# Patient Record
Sex: Female | Born: 1990 | Race: White | Hispanic: No | Smoking: Former smoker
Health system: Southern US, Community
[De-identification: ages and names within clinical notes are randomized; demographics above are authoritative.]

## PROBLEM LIST (undated history)

## (undated) ENCOUNTER — Inpatient Hospital Stay (HOSPITAL_COMMUNITY): Admission: RE | Payer: BLUE CROSS/BLUE SHIELD | Source: Ambulatory Visit

## (undated) ENCOUNTER — Inpatient Hospital Stay (HOSPITAL_COMMUNITY): Payer: Self-pay

## (undated) DIAGNOSIS — I499 Cardiac arrhythmia, unspecified: Secondary | ICD-10-CM

## (undated) DIAGNOSIS — K219 Gastro-esophageal reflux disease without esophagitis: Secondary | ICD-10-CM

## (undated) DIAGNOSIS — M419 Scoliosis, unspecified: Secondary | ICD-10-CM

## (undated) DIAGNOSIS — N390 Urinary tract infection, site not specified: Secondary | ICD-10-CM

## (undated) DIAGNOSIS — E162 Hypoglycemia, unspecified: Secondary | ICD-10-CM

## (undated) DIAGNOSIS — F32A Depression, unspecified: Secondary | ICD-10-CM

## (undated) DIAGNOSIS — R55 Syncope and collapse: Secondary | ICD-10-CM

## (undated) DIAGNOSIS — R51 Headache: Secondary | ICD-10-CM

## (undated) DIAGNOSIS — J45990 Exercise induced bronchospasm: Secondary | ICD-10-CM

## (undated) DIAGNOSIS — R519 Headache, unspecified: Secondary | ICD-10-CM

## (undated) DIAGNOSIS — R569 Unspecified convulsions: Secondary | ICD-10-CM

## (undated) DIAGNOSIS — F329 Major depressive disorder, single episode, unspecified: Secondary | ICD-10-CM

## (undated) HISTORY — PX: GASTROSTOMY TUBE PLACEMENT: SHX655

## (undated) HISTORY — PX: ABDOMINAL SURGERY: SHX537

## (undated) HISTORY — PX: MOUTH SURGERY: SHX715

---
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
## 2010-08-15 ENCOUNTER — Ambulatory Visit (HOSPITAL_COMMUNITY): Admission: RE | Admit: 2010-08-15 | Discharge: 2010-08-15 | Payer: Self-pay | Admitting: Obstetrics and Gynecology

## 2010-09-08 ENCOUNTER — Ambulatory Visit (HOSPITAL_COMMUNITY): Admission: RE | Admit: 2010-09-08 | Discharge: 2010-09-08 | Payer: Self-pay | Admitting: Obstetrics and Gynecology

## 2010-09-28 ENCOUNTER — Ambulatory Visit (HOSPITAL_COMMUNITY): Admission: RE | Admit: 2010-09-28 | Discharge: 2010-09-28 | Payer: Self-pay | Admitting: Obstetrics and Gynecology

## 2010-11-22 ENCOUNTER — Inpatient Hospital Stay (HOSPITAL_COMMUNITY)
Admission: AD | Admit: 2010-11-22 | Discharge: 2010-11-22 | Payer: Self-pay | Source: Home / Self Care | Admitting: Obstetrics and Gynecology

## 2010-11-22 DIAGNOSIS — R300 Dysuria: Secondary | ICD-10-CM

## 2010-11-22 DIAGNOSIS — O368190 Decreased fetal movements, unspecified trimester, not applicable or unspecified: Secondary | ICD-10-CM

## 2010-11-22 DIAGNOSIS — O212 Late vomiting of pregnancy: Secondary | ICD-10-CM

## 2010-12-31 ENCOUNTER — Inpatient Hospital Stay (HOSPITAL_COMMUNITY)
Admission: AD | Admit: 2010-12-31 | Discharge: 2011-01-01 | Payer: Self-pay | Source: Home / Self Care | Attending: Obstetrics and Gynecology | Admitting: Obstetrics and Gynecology

## 2011-01-07 ENCOUNTER — Encounter: Payer: Self-pay | Admitting: Obstetrics and Gynecology

## 2011-02-28 LAB — URINE CULTURE
Colony Count: 45000
Culture  Setup Time: 201112070146

## 2011-02-28 LAB — URINALYSIS, ROUTINE W REFLEX MICROSCOPIC
Bilirubin Urine: NEGATIVE
Glucose, UA: NEGATIVE mg/dL
Hgb urine dipstick: NEGATIVE
Ketones, ur: NEGATIVE mg/dL
Nitrite: NEGATIVE
Protein, ur: NEGATIVE mg/dL
Specific Gravity, Urine: 1.02 (ref 1.005–1.030)
Urobilinogen, UA: 0.2 mg/dL (ref 0.0–1.0)
pH: 6.5 (ref 5.0–8.0)

## 2011-02-28 LAB — URINE MICROSCOPIC-ADD ON

## 2011-03-22 LAB — COMPREHENSIVE METABOLIC PANEL
ALT: 15 U/L (ref 0–35)
AST: 22 U/L (ref 0–37)
Albumin: 4 g/dL (ref 3.5–5.2)
Alkaline Phosphatase: 44 U/L (ref 39–117)
BUN: 10 mg/dL (ref 6–23)
CO2: 26 mEq/L (ref 19–32)
Calcium: 9.2 mg/dL (ref 8.4–10.5)
Chloride: 108 mEq/L (ref 96–112)
Creatinine, Ser: 0.63 mg/dL (ref 0.4–1.2)
GFR calc Af Amer: >60 mL/min (ref >60)
GFR calc non Af Amer: >60 mL/min (ref >60)
Glucose, Bld: 101 mg/dL — ABNORMAL HIGH (ref 70–99)
Potassium: 4.3 mEq/L (ref 3.5–5.1)
Sodium: 138 mEq/L (ref 135–145)
Total Bilirubin: 0.3 mg/dL (ref 0.3–1.2)
Total Protein: 6.6 g/dL (ref 6.0–8.3)

## 2011-03-22 LAB — DIFFERENTIAL
Basophils Absolute: 0.1 10*3/uL (ref 0.0–0.1)
Basophils Relative: 1 % (ref 0–1)
Eosinophils Absolute: 0.1 10*3/uL (ref 0.0–0.7)
Eosinophils Relative: 2 % (ref 0–5)
Lymphocytes Relative: 42 % (ref 12–46)
Lymphs Abs: 2.3 10*3/uL (ref 0.7–4.0)
Monocytes Absolute: 0.6 10*3/uL (ref 0.1–1.0)
Monocytes Relative: 10 % (ref 3–12)
Neutro Abs: 2.3 10*3/uL (ref 1.7–7.7)
Neutrophils Relative %: 44 % (ref 43–77)

## 2011-03-22 LAB — URINE MICROSCOPIC-ADD ON

## 2011-03-22 LAB — URINALYSIS, ROUTINE W REFLEX MICROSCOPIC
Bilirubin Urine: NEGATIVE
Glucose, UA: NEGATIVE mg/dL
Ketones, ur: NEGATIVE mg/dL
Leukocytes, UA: NEGATIVE
Nitrite: NEGATIVE
Protein, ur: NEGATIVE mg/dL
Specific Gravity, Urine: 1.016 (ref 1.005–1.030)
Urobilinogen, UA: 0.2 mg/dL (ref 0.0–1.0)
pH: 6 (ref 5.0–8.0)

## 2011-03-22 LAB — CBC
HCT: 36.5 % (ref 36.0–46.0)
Hemoglobin: 13 g/dL (ref 12.0–15.0)
MCHC: 35.5 g/dL (ref 30.0–36.0)
MCV: 89.4 fL (ref 78.0–100.0)
Platelets: 270 10*3/uL (ref 150–400)
RBC: 4.09 MIL/uL (ref 3.87–5.11)
RDW: 12.6 % (ref 11.5–15.5)
WBC: 5.4 10*3/uL (ref 4.0–10.5)
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
## 2011-09-13 LAB — RAPID URINE DRUG SCREEN, HOSP PERFORMED
Amphetamines: POSITIVE — AB
Barbiturates: NOT DETECTED
Benzodiazepines: NOT DETECTED
Cocaine: NOT DETECTED
Opiates: NOT DETECTED
Tetrahydrocannabinol: NOT DETECTED

## 2011-09-13 LAB — DIFFERENTIAL
Basophils Absolute: 0
Basophils Relative: 1
Eosinophils Absolute: 0
Eosinophils Relative: 0
Lymphocytes Relative: 42
Lymphs Abs: 1.8
Monocytes Absolute: 0.4
Monocytes Relative: 9
Neutro Abs: 2.1
Neutrophils Relative %: 49

## 2011-09-13 LAB — URINALYSIS, ROUTINE W REFLEX MICROSCOPIC
Bilirubin Urine: NEGATIVE
Glucose, UA: NEGATIVE
Hgb urine dipstick: NEGATIVE
Ketones, ur: NEGATIVE
Nitrite: NEGATIVE
Protein, ur: NEGATIVE
Specific Gravity, Urine: 1.019
Urobilinogen, UA: 0.2
pH: 6.5

## 2011-09-13 LAB — POCT CARDIAC MARKERS
Myoglobin, poc: 35.6
Operator id: 261601
Troponin i, poc: <0.05

## 2011-09-13 LAB — CBC
HCT: 36.7
Hemoglobin: 12.7
MCHC: 34.7
MCV: 87.9
Platelets: 239
RBC: 4.18
RDW: 13.2
WBC: 4.2 — ABNORMAL LOW

## 2011-09-13 LAB — D-DIMER, QUANTITATIVE: D-Dimer, Quant: <0.22

## 2011-09-13 LAB — POCT I-STAT, CHEM 8
BUN: 12
Calcium, Ion: 1.29
Chloride: 105
Creatinine, Ser: 0.8
Glucose, Bld: 78
HCT: 37
Hemoglobin: 12.6
Potassium: 3.8
Sodium: 142
TCO2: 25

## 2011-09-13 LAB — PREGNANCY, URINE: Preg Test, Ur: NEGATIVE

## 2011-09-18 LAB — COMPREHENSIVE METABOLIC PANEL
ALT: 11
AST: 19
Albumin: 4.2
Alkaline Phosphatase: 57
Chloride: 107
Creatinine, Ser: 0.74
Potassium: 4.5
Sodium: 137
Total Bilirubin: 0.4

## 2011-09-18 LAB — ACETAMINOPHEN LEVEL: Acetaminophen (Tylenol), Serum: <10 — ABNORMAL LOW

## 2011-09-18 LAB — POCT I-STAT, CHEM 8
BUN: 10
Chloride: 109
Creatinine, Ser: 0.9
Potassium: 4.3
Sodium: 140

## 2011-09-18 LAB — URINALYSIS, ROUTINE W REFLEX MICROSCOPIC
Bilirubin Urine: NEGATIVE
Glucose, UA: NEGATIVE
Hgb urine dipstick: NEGATIVE
Ketones, ur: NEGATIVE
Nitrite: NEGATIVE
Protein, ur: NEGATIVE
Specific Gravity, Urine: 1.009
Urobilinogen, UA: 0.2
pH: 6

## 2011-09-18 LAB — APTT: aPTT: 28

## 2011-09-18 LAB — RAPID URINE DRUG SCREEN, HOSP PERFORMED
Amphetamines: NOT DETECTED
Barbiturates: NOT DETECTED
Benzodiazepines: NOT DETECTED
Cocaine: NOT DETECTED
Opiates: NOT DETECTED
Tetrahydrocannabinol: NOT DETECTED

## 2011-09-18 LAB — POCT PREGNANCY, URINE: Preg Test, Ur: NEGATIVE

## 2012-02-26 ENCOUNTER — Encounter (HOSPITAL_COMMUNITY): Payer: Self-pay | Admitting: *Deleted

## 2012-02-26 ENCOUNTER — Other Ambulatory Visit: Payer: Self-pay

## 2012-02-26 ENCOUNTER — Emergency Department (HOSPITAL_COMMUNITY)
Admission: EM | Admit: 2012-02-26 | Discharge: 2012-02-26 | Disposition: A | Discharge status: Home / Self Care | Payer: BC Managed Care – PPO | Attending: Emergency Medicine | Admitting: Emergency Medicine

## 2012-02-26 DIAGNOSIS — I499 Cardiac arrhythmia, unspecified: Secondary | ICD-10-CM | POA: Insufficient documentation

## 2012-02-26 DIAGNOSIS — R55 Syncope and collapse: Secondary | ICD-10-CM | POA: Insufficient documentation

## 2012-02-26 DIAGNOSIS — R42 Dizziness and giddiness: Secondary | ICD-10-CM | POA: Insufficient documentation

## 2012-02-26 DIAGNOSIS — J3489 Other specified disorders of nose and nasal sinuses: Secondary | ICD-10-CM | POA: Insufficient documentation

## 2012-02-26 DIAGNOSIS — R509 Fever, unspecified: Secondary | ICD-10-CM | POA: Insufficient documentation

## 2012-02-26 DIAGNOSIS — R059 Cough, unspecified: Secondary | ICD-10-CM | POA: Insufficient documentation

## 2012-02-26 DIAGNOSIS — R05 Cough: Secondary | ICD-10-CM | POA: Insufficient documentation

## 2012-02-26 HISTORY — DX: Hypoglycemia, unspecified: E16.2

## 2012-02-26 LAB — URINALYSIS, ROUTINE W REFLEX MICROSCOPIC
Bilirubin Urine: NEGATIVE
Nitrite: NEGATIVE
Specific Gravity, Urine: 1.005 (ref 1.005–1.030)
Urobilinogen, UA: 1 mg/dL (ref 0.0–1.0)

## 2012-02-26 LAB — DIFFERENTIAL
Basophils Relative: 0 % (ref 0–1)
Eosinophils Absolute: 0.1 10*3/uL (ref 0.0–0.7)
Lymphs Abs: 2.5 10*3/uL (ref 0.7–4.0)
Neutrophils Relative %: 57 % (ref 43–77)

## 2012-02-26 LAB — CBC
MCH: 29.3 pg (ref 26.0–34.0)
MCHC: 33.9 g/dL (ref 30.0–36.0)
Platelets: 241 10*3/uL (ref 150–400)
RBC: 4.3 MIL/uL (ref 3.87–5.11)

## 2012-02-26 LAB — BASIC METABOLIC PANEL
GFR calc Af Amer: >90 mL/min (ref >90)
GFR calc non Af Amer: >90 mL/min (ref >90)
Potassium: 3.8 mEq/L (ref 3.5–5.1)
Sodium: 141 mEq/L (ref 135–145)

## 2012-02-26 LAB — POCT PREGNANCY, URINE: Preg Test, Ur: NEGATIVE

## 2012-02-26 LAB — GLUCOSE, CAPILLARY

## 2012-02-26 LAB — TROPONIN I: Troponin I: <0.3 ng/mL (ref <0.30)

## 2012-02-26 MED ORDER — SODIUM CHLORIDE 0.9 % IV BOLUS (SEPSIS)
1000.0000 mL | Freq: Once | INTRAVENOUS | Status: AC
  Administered 2012-02-26: 1000 mL via INTRAVENOUS

## 2012-02-26 MED ORDER — IBUPROFEN 200 MG PO TABS
600.0000 mg | ORAL_TABLET | Freq: Once | ORAL | Status: AC
  Administered 2012-02-26: 600 mg via ORAL
  Filled 2012-02-26: qty 3

## 2012-02-26 NOTE — Discharge Instructions (Signed)
Nicole [name] your EKG shows you have premature complexes which is a benign finding.  The heart labs were normal today.  Call in the am and make an appointment with your PCP.  Tell them you were in the ER today.  You did not have a fever in the ER.  Your sugar was normal.  You may need to wear a holter monitor.  Your PCP can send you to a cardiologist for this.  Return to the ER for severe SOB or other concerns. We found no source of your fever.  Your white count is normal.  You are not pregnant.  Labs included in discharge.   Near-Syncope Near-syncope is sudden weakness, dizziness, or feeling like you might pass out (faint). This may occur when getting up after sitting or while standing for a long period of time. Near-syncope can be caused by a drop in blood pressure. This is a common reaction, but it may occur to a greater degree in people taking medicines to control their blood pressure. Fainting often occurs when the blood pressure or pulse is too low to provide enough blood flow to the brain to keep you conscious. Fainting and near-syncope are not usually due to serious medical problems. However, certain people should be more cautious in the event of near-syncope, including elderly patients, patients with diabetes, and patients with a history of heart conditions (especially irregular rhythms).  CAUSES   Drop in blood pressure.   Physical pain.   Dehydration.   Heat exhaustion.   Emotional distress.   Low blood sugar.   Internal bleeding.   Heart and circulatory problems.   Infections.  SYMPTOMS   Dizziness.   Feeling sick to your stomach (nauseous).   Nearly fainting.   Body numbness.   Turning pale.   Tunnel vision.   Weakness.  HOME CARE INSTRUCTIONS   Lie down right away if you start feeling like you might faint. Breathe deeply and steadily. Wait until all the symptoms have passed. Most of these episodes last only a few minutes. You may feel tired for several hours.    Drink enough fluids to keep your urine clear or pale yellow.   If you are taking blood pressure or heart medicine, get up slowly, taking several minutes to sit and then stand. This can reduce dizziness that is caused by a drop in blood pressure.  SEEK IMMEDIATE MEDICAL CARE IF:   You have a severe headache.   Unusual pain develops in the chest, abdomen, or back.   There is bleeding from the mouth or rectum, or you have black or tarry stool.   An irregular heartbeat or a very rapid pulse develops.   You have repeated fainting or seizure-like jerking during an episode.   You faint when sitting or lying down.   You develop confusion.   You have difficulty walking.   Severe weakness develops.   Vision problems develop.  MAKE SURE YOU:   Understand these instructions.   Will watch your condition.   Will get help right away if you are not doing well or get worse.  Document Released: 12/04/2005 Document Revised: 11/23/2011 Document Reviewed: 01/20/2011 [facility] Patient Information 2012 [place], [place].Near-Syncope Near-syncope is sudden weakness, dizziness, or feeling like you might pass out (faint). This may occur when getting up after sitting or while standing for a long period of time. Near-syncope can be caused by a drop in blood pressure. This is a common reaction, but it may occur to a greater  degree in people taking medicines to control their blood pressure. Fainting often occurs when the blood pressure or pulse is too low to provide enough blood flow to the brain to keep you conscious. Fainting and near-syncope are not usually due to serious medical problems. However, certain people should be more cautious in the event of near-syncope, including elderly patients, patients with diabetes, and patients with a history of heart conditions (especially irregular rhythms).  CAUSES   Drop in blood pressure.   Physical pain.   Dehydration.   Heat exhaustion.   Emotional  distress.   Low blood sugar.   Internal bleeding.   Heart and circulatory problems.   Infections.  SYMPTOMS   Dizziness.   Feeling sick to your stomach (nauseous).   Nearly fainting.   Body numbness.   Turning pale.   Tunnel vision.   Weakness.  HOME CARE INSTRUCTIONS   Lie down right away if you start feeling like you might faint. Breathe deeply and steadily. Wait until all the symptoms have passed. Most of these episodes last only a few minutes. You may feel tired for several hours.   Drink enough fluids to keep your urine clear or pale yellow.   If you are taking blood pressure or heart medicine, get up slowly, taking several minutes to sit and then stand. This can reduce dizziness that is caused by a drop in blood pressure.  SEEK IMMEDIATE MEDICAL CARE IF:   You have a severe headache.   Unusual pain develops in the chest, abdomen, or back.   There is bleeding from the mouth or rectum, or you have black or tarry stool.   An irregular heartbeat or a very rapid pulse develops.   You have repeated fainting or seizure-like jerking during an episode.   You faint when sitting or lying down.   You develop confusion.   You have difficulty walking.   Severe weakness develops.   Vision problems develop.  MAKE SURE YOU:   Understand these instructions.   Will watch your condition.   Will get help right away if you are not doing well or get worse.  Document Released: 12/04/2005 Document Revised: 11/23/2011 Document Reviewed: 01/20/2011 [employer] Patient Information 2012 [employer], [facility] Monitoring A Holter monitor is a small device with electrodes (small sticky patches) that attach to your chest. It records the electrical activity of your heart and is worn continuously for 24-48 hours.  A HOLTER MONITOR IS USED TO  Detect heart problems such as:   Heart arrhythmia. Is an abnormal or irregular heartbeat. With some heart arrhythmias, you may not feel or know  that you have an irregular heart rhythm.   Palpitations, such as feeling your heart racing or fluttering. It is possible to have heart palpitations and not have a heart arrhythmia.   A heart rhythm that is too slow or too fast.   If you have problems fainting, near fainting or feeling light-headed, a Holter monitor may be worn to see if your heart is the cause.  HOLTER MONITOR PREPARATION   Electrodes will be attached to the skin on your chest.   If you have hair on your chest, small areas may have to be shaved. This is done to help the patches stick better and make the recording more accurate.   The electrodes are attached by wires to the Holter monitor. The Holter monitor clips to your clothing. You will wear the monitor at all times, even while exercising and sleeping.  HOME CARE  INSTRUCTIONS   Wear your monitor at all times.   The wires and the monitor must stay dry. Do not get the monitor wet.   Do not bathe, swim or use a hot tub with it on.   You may do a "sponge" bath while you have the monitor on.   Keep your skin clean, do not put body lotion or moisturizer on your chest.   It's possible that your skin under the electrodes could become irritated. To keep this from happening, you may put the electrodes in slightly different places on your chest.   Your caregiver will also ask you to keep a diary of your activities, such as walking or doing chores. Be sure to note what you are doing if you experience heart symptoms such as palpitations. This will help your caregiver determine what might be contributing to your symptoms. The information stored in your monitor will be reviewed by your caregiver alongside your diary entries.   Make sure the monitor is safely clipped to your clothing or in a location close to your body that your caregiver recommends.   The monitor and electrodes are removed when the test is over. Return the monitor as directed.   Be sure to follow up with your  caregiver and discuss your Holter monitor results.  SEEK IMMEDIATE MEDICAL CARE IF:  You faint or feel lightheaded.   You have trouble breathing.   You get pain in your chest, upper arm or jaw.   You feel sick to your stomach and your skin is pale, cool, or damp.   You think something is wrong with the way your heart is beating.  MAKE SURE YOU:   Understand these instructions.   Will watch your condition.   Will get help right away if you are not doing well or get worse.  Document Released: 09/01/2004 Document Revised: 11/23/2011 Document Reviewed: 01/14/2009 [facility] Patient Information 2012 [place], [place].C.

## 2012-02-26 NOTE — ED Notes (Signed)
Pt ambulated to the restroom to void.

## 2012-02-26 NOTE — ED Provider Notes (Signed)
History     CSN: [id number]  Arrival date & time 02/26/12  1616   First MD Initiated Contact with Patient 02/26/12 1742      Chief Complaint  Patient presents with  . Hypoglycemia    pt reports dizzy spells since saturday, reports near sycope on saturday.   . Fever    (Consider location/radiation/quality/duration/timing/severity/associated sxs/prior treatment) Patient is a 21 y.o. female presenting with fever. The history is provided by the patient. No language interpreter was used.  Fever Primary symptoms of the febrile illness include fever and cough. Primary symptoms do not include headaches, wheezing, shortness of breath, abdominal pain, nausea, vomiting, diarrhea, dysuria or altered mental status. The current episode started 2 days ago. This is a new problem. The problem has not changed since onset. Reports subjective fever, upper respiratory symptoms x 2 days and dizziness with near syncope. Thinks that she is hypoglycemic.   Also states that she has a heart arrythmia but not sure what it is.  No SOB or chest pain. pmh of hypoglycemia also. She is a smoker. States that she is not pregnant that her and her husband use condoms.  LMP Feb 13.  Past Medical History  Diagnosis Date  . Hypoglycemia     History reviewed. No pertinent past surgical history.  History reviewed. No pertinent family history.  History  Substance Use Topics  . Smoking status: Current Everyday Smoker    Types: Cigarettes  . Smokeless tobacco: Not on file  . Alcohol Use: No    OB History    Grav Para Term Preterm Abortions TAB SAB Ect Mult Living                  Review of Systems  Constitutional: Positive for fever.  HENT: Positive for rhinorrhea, sneezing and postnasal drip. Negative for neck pain and neck stiffness.   Respiratory: Positive for cough. Negative for shortness of breath and wheezing.   Cardiovascular: Negative for chest pain.  Gastrointestinal: Negative for nausea, vomiting,  abdominal pain and diarrhea.  Genitourinary: Negative for dysuria, urgency, frequency, vaginal bleeding, vaginal discharge, difficulty urinating, vaginal pain and menstrual problem.  Musculoskeletal: Negative for back pain.  Neurological: Positive for dizziness. Negative for seizures, weakness, numbness and headaches.  Psychiatric/Behavioral: Negative for altered mental status.  All other systems reviewed and are negative.    Allergies  Review of patient's allergies indicates no known allergies.  Home Medications  No current outpatient prescriptions on file.  BP 113/76  Pulse 75  Temp(Src) 98.4 F (36.9 C) (Oral)  Resp 22  Wt 135 lb (61.236 kg)  SpO2 99%  LMP 02/02/2012  Physical Exam  Nursing note and vitals reviewed. Constitutional: She is oriented to person, place, and time. She appears well-developed and well-nourished.  HENT:  Head: Normocephalic and atraumatic.  Right Ear: Tympanic membrane normal. No tenderness.  Left Ear: Tympanic membrane normal. No tenderness.  Nose: Mucosal edema and rhinorrhea present. Right sinus exhibits no maxillary sinus tenderness and no frontal sinus tenderness. Left sinus exhibits no maxillary sinus tenderness and no frontal sinus tenderness.  Mouth/Throat: Uvula is midline and mucous membranes are normal. No oropharyngeal exudate, posterior oropharyngeal edema, posterior oropharyngeal erythema or tonsillar abscesses.  Eyes: Conjunctivae and EOM are normal. Pupils are equal, round, and reactive to light.  Neck: Normal range of motion. Neck supple.  Cardiovascular: Normal rate and intact distal pulses.  Exam reveals no gallop and no friction rub.   No murmur heard.  Irregular rhythm  Pulmonary/Chest: Effort normal and breath sounds normal. No respiratory distress. She has no wheezes.  Abdominal: Soft.  Musculoskeletal: Normal range of motion. She exhibits no edema and no tenderness.  Neurological: She is alert and oriented to person,  place, and time. She has normal reflexes.  Skin: Skin is warm and dry.  Psychiatric: She has a normal mood and affect.    ED Course  Procedures (including critical care time)  Labs Reviewed  URINALYSIS, ROUTINE W REFLEX MICROSCOPIC - Abnormal; Notable for the following:    Color, Urine STRAW (*)    Ketones, ur 15 (*)    All other components within normal limits  GLUCOSE, CAPILLARY  CBC  DIFFERENTIAL  BASIC METABOLIC PANEL  POCT PREGNANCY, URINE  TROPONIN I   No results found.   No diagnosis found.    MDM  20yo near syncope with uri and freq pac's.  Troponin negative. Feels better after IV fluids in the ER.  Plan to follow up with PCP tomorrow in [place].  Labs and EKG copy to pcp. Benign pac's in the ER.  ? Cause of dizziness.  May need halter monitor.  She will discuss with PCP tomorrow. CBC and Bmet unremarkable.    Labs Reviewed  URINALYSIS, ROUTINE W REFLEX MICROSCOPIC - Abnormal; Notable for the following:    Color, Urine STRAW (*)    Ketones, ur 15 (*)    All other components within normal limits  GLUCOSE, CAPILLARY  CBC  DIFFERENTIAL  BASIC METABOLIC PANEL  POCT PREGNANCY, URINE  TROPONIN I  LAB REPORT - SCANNED          Jethro Bastos, NP 02/28/12 1256

## 2012-02-26 NOTE — ED Notes (Signed)
Unable to run mini-lab troponin so per Dr. Rosalia Hammers order changed to main lab version.

## 2012-02-29 NOTE — ED Provider Notes (Signed)
History/physical exam/procedure(s) were performed by non-physician practitioner and as supervising physician I was immediately available for consultation/collaboration. I have reviewed all notes and am in agreement with care and plan.   Hilario Quarry, MD 02/29/12 [phone]

## 2012-04-29 ENCOUNTER — Other Ambulatory Visit: Payer: Self-pay | Admitting: Obstetrics and Gynecology

## 2012-04-29 DIAGNOSIS — N63 Unspecified lump in unspecified breast: Secondary | ICD-10-CM

## 2012-05-02 ENCOUNTER — Other Ambulatory Visit: Payer: BC Managed Care – PPO

## 2012-05-02 ENCOUNTER — Ambulatory Visit
Admission: RE | Admit: 2012-05-02 | Discharge: 2012-05-02 | Disposition: A | Discharge status: Home / Self Care | Payer: BC Managed Care – PPO | Source: Ambulatory Visit | Attending: Obstetrics and Gynecology | Admitting: Obstetrics and Gynecology

## 2012-05-02 DIAGNOSIS — N63 Unspecified lump in unspecified breast: Secondary | ICD-10-CM

## 2013-10-26 ENCOUNTER — Emergency Department (HOSPITAL_COMMUNITY)
Admission: EM | Admit: 2013-10-26 | Discharge: 2013-10-26 | Disposition: A | Discharge status: Home / Self Care | Payer: BC Managed Care – PPO | Attending: Emergency Medicine | Admitting: Emergency Medicine

## 2013-10-26 ENCOUNTER — Emergency Department (HOSPITAL_COMMUNITY): Payer: BC Managed Care – PPO

## 2013-10-26 ENCOUNTER — Encounter (HOSPITAL_COMMUNITY): Payer: Self-pay | Admitting: Emergency Medicine

## 2013-10-26 DIAGNOSIS — S60229A Contusion of unspecified hand, initial encounter: Secondary | ICD-10-CM | POA: Insufficient documentation

## 2013-10-26 DIAGNOSIS — M549 Dorsalgia, unspecified: Secondary | ICD-10-CM

## 2013-10-26 DIAGNOSIS — Z8739 Personal history of other diseases of the musculoskeletal system and connective tissue: Secondary | ICD-10-CM | POA: Insufficient documentation

## 2013-10-26 DIAGNOSIS — S0003XA Contusion of scalp, initial encounter: Secondary | ICD-10-CM | POA: Insufficient documentation

## 2013-10-26 DIAGNOSIS — Z862 Personal history of diseases of the blood and blood-forming organs and certain disorders involving the immune mechanism: Secondary | ICD-10-CM | POA: Insufficient documentation

## 2013-10-26 DIAGNOSIS — S60222A Contusion of left hand, initial encounter: Secondary | ICD-10-CM

## 2013-10-26 DIAGNOSIS — S1093XA Contusion of unspecified part of neck, initial encounter: Secondary | ICD-10-CM | POA: Insufficient documentation

## 2013-10-26 DIAGNOSIS — Y92410 Unspecified street and highway as the place of occurrence of the external cause: Secondary | ICD-10-CM | POA: Insufficient documentation

## 2013-10-26 DIAGNOSIS — Z8639 Personal history of other endocrine, nutritional and metabolic disease: Secondary | ICD-10-CM | POA: Insufficient documentation

## 2013-10-26 DIAGNOSIS — IMO0002 Reserved for concepts with insufficient information to code with codable children: Secondary | ICD-10-CM | POA: Insufficient documentation

## 2013-10-26 DIAGNOSIS — S7010XA Contusion of unspecified thigh, initial encounter: Secondary | ICD-10-CM | POA: Insufficient documentation

## 2013-10-26 DIAGNOSIS — S0083XA Contusion of other part of head, initial encounter: Secondary | ICD-10-CM

## 2013-10-26 DIAGNOSIS — F17200 Nicotine dependence, unspecified, uncomplicated: Secondary | ICD-10-CM | POA: Insufficient documentation

## 2013-10-26 DIAGNOSIS — M542 Cervicalgia: Secondary | ICD-10-CM

## 2013-10-26 DIAGNOSIS — Z3202 Encounter for pregnancy test, result negative: Secondary | ICD-10-CM | POA: Insufficient documentation

## 2013-10-26 DIAGNOSIS — Y9389 Activity, other specified: Secondary | ICD-10-CM | POA: Insufficient documentation

## 2013-10-26 MED ORDER — NAPROXEN 500 MG PO TABS: 500.0000 mg | ORAL_TABLET | Freq: Two times a day (BID) | ORAL | Status: DC

## 2013-10-26 MED ORDER — IBUPROFEN 400 MG PO TABS
600.0000 mg | ORAL_TABLET | Freq: Once | ORAL | Status: AC
Start: 2013-10-26 — End: 2013-10-26
  Administered 2013-10-26: 600 mg via ORAL
  Filled 2013-10-26: qty 2

## 2013-10-26 MED ORDER — CYCLOBENZAPRINE HCL 5 MG PO TABS: 5.0000 mg | ORAL_TABLET | Freq: Three times a day (TID) | ORAL | Status: DC | PRN

## 2013-10-26 MED ORDER — HYDROCODONE-ACETAMINOPHEN 5-325 MG PO TABS
1.0000 | ORAL_TABLET | Freq: Once | ORAL | Status: AC
  Administered 2013-10-26: 1 via ORAL
  Filled 2013-10-26: qty 1
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
## 2013-10-26 NOTE — ED Notes (Signed)
Crackers given with medication - patient states she is hungry

## 2013-10-26 NOTE — ED Notes (Signed)
Discharge instructions given and reviewed with patient.  Prescription given for Flexeril and Naproxen; effects and use explained.  Patient verbalized understanding of sedating effects of Flexeril and to take medications as directed.  Patient ambulatory; discharged home in good condition.  Spouse accompanied discharge to drive home.

## 2014-06-07 ENCOUNTER — Encounter (HOSPITAL_COMMUNITY): Payer: Self-pay | Admitting: Emergency Medicine

## 2014-06-07 ENCOUNTER — Emergency Department (HOSPITAL_COMMUNITY)
Admission: EM | Admit: 2014-06-07 | Discharge: 2014-06-07 | Disposition: A | Discharge status: Home / Self Care | Payer: BC Managed Care – PPO | Attending: Emergency Medicine | Admitting: Emergency Medicine

## 2014-06-07 DIAGNOSIS — Z87891 Personal history of nicotine dependence: Secondary | ICD-10-CM | POA: Insufficient documentation

## 2014-06-07 DIAGNOSIS — Z79899 Other long term (current) drug therapy: Secondary | ICD-10-CM | POA: Insufficient documentation

## 2014-06-07 DIAGNOSIS — Z8739 Personal history of other diseases of the musculoskeletal system and connective tissue: Secondary | ICD-10-CM | POA: Insufficient documentation

## 2014-06-07 DIAGNOSIS — N10 Acute tubulo-interstitial nephritis: Secondary | ICD-10-CM

## 2014-06-07 DIAGNOSIS — Z3202 Encounter for pregnancy test, result negative: Secondary | ICD-10-CM | POA: Insufficient documentation

## 2014-06-07 HISTORY — DX: Scoliosis, unspecified: M41.9

## 2014-06-07 LAB — COMPREHENSIVE METABOLIC PANEL
ALBUMIN: 3.9 g/dL (ref 3.5–5.2)
ALT: 9 U/L (ref 0–35)
AST: 14 U/L (ref 0–37)
Alkaline Phosphatase: 69 U/L (ref 39–117)
BILIRUBIN TOTAL: 0.2 mg/dL — AB (ref 0.3–1.2)
BUN: 8 mg/dL (ref 6–23)
CALCIUM: 8.8 mg/dL (ref 8.4–10.5)
CO2: 23 meq/L (ref 19–32)
CREATININE: 0.72 mg/dL (ref 0.50–1.10)
Chloride: 103 mEq/L (ref 96–112)
GFR calc Af Amer: >90 mL/min (ref >90)
Glucose, Bld: 95 mg/dL (ref 70–99)
Potassium: 3.8 mEq/L (ref 3.7–5.3)
Sodium: 139 mEq/L (ref 137–147)
Total Protein: 6.9 g/dL (ref 6.0–8.3)

## 2014-06-07 LAB — CBC WITH DIFFERENTIAL/PLATELET
BASOS ABS: 0 10*3/uL (ref 0.0–0.1)
BASOS PCT: 1 % (ref 0–1)
EOS PCT: 0 % (ref 0–5)
Eosinophils Absolute: 0 10*3/uL (ref 0.0–0.7)
HEMATOCRIT: 36.6 % (ref 36.0–46.0)
HEMOGLOBIN: 12.2 g/dL (ref 12.0–15.0)
Lymphocytes Relative: 22 % (ref 12–46)
Lymphs Abs: 0.9 10*3/uL (ref 0.7–4.0)
MCH: 29.9 pg (ref 26.0–34.0)
MCHC: 33.3 g/dL (ref 30.0–36.0)
MCV: 89.7 fL (ref 78.0–100.0)
MONO ABS: 0.6 10*3/uL (ref 0.1–1.0)
MONOS PCT: 14 % — AB (ref 3–12)
Neutro Abs: 2.5 10*3/uL (ref 1.7–7.7)
Neutrophils Relative %: 63 % (ref 43–77)
Platelets: 160 10*3/uL (ref 150–400)
RBC: 4.08 MIL/uL (ref 3.87–5.11)
RDW: 13.7 % (ref 11.5–15.5)
WBC: 4 10*3/uL (ref 4.0–10.5)

## 2014-06-07 LAB — URINALYSIS, ROUTINE W REFLEX MICROSCOPIC
Bilirubin Urine: NEGATIVE
Glucose, UA: NEGATIVE mg/dL
Ketones, ur: NEGATIVE mg/dL
LEUKOCYTES UA: NEGATIVE
NITRITE: POSITIVE — AB
PROTEIN: NEGATIVE mg/dL
Specific Gravity, Urine: 1.02 (ref 1.005–1.030)
UROBILINOGEN UA: 0.2 mg/dL (ref 0.0–1.0)
pH: 5.5 (ref 5.0–8.0)

## 2014-06-07 LAB — URINE MICROSCOPIC-ADD ON

## 2014-06-07 LAB — PREGNANCY, URINE: Preg Test, Ur: NEGATIVE

## 2014-06-07 MED ORDER — HYDROMORPHONE HCL PF 1 MG/ML IJ SOLN
1.0000 mg | INTRAMUSCULAR | Status: AC
  Administered 2014-06-07: 1 mg via INTRAVENOUS
  Filled 2014-06-07: qty 1

## 2014-06-07 MED ORDER — CIPROFLOXACIN HCL 500 MG PO TABS: 500.0000 mg | ORAL_TABLET | Freq: Two times a day (BID) | ORAL | Status: DC

## 2014-06-07 MED ORDER — SODIUM CHLORIDE 0.9 % IV BOLUS (SEPSIS)
1000.0000 mL | Freq: Once | INTRAVENOUS | Status: AC
  Administered 2014-06-07: 1000 mL via INTRAVENOUS

## 2014-06-07 MED ORDER — OXYCODONE-ACETAMINOPHEN 5-325 MG PO TABS: 1.0000 | ORAL_TABLET | ORAL | Status: DC | PRN

## 2014-06-07 MED ORDER — ONDANSETRON HCL 4 MG PO TABS: 4.0000 mg | ORAL_TABLET | Freq: Three times a day (TID) | ORAL | Status: DC | PRN

## 2014-06-07 MED ORDER — ACETAMINOPHEN 325 MG PO TABS
975.0000 mg | ORAL_TABLET | Freq: Once | ORAL | Status: AC
  Administered 2014-06-07: 975 mg via ORAL
  Filled 2014-06-07: qty 3

## 2014-06-07 MED ORDER — DEXTROSE 5 % IV SOLN
1.0000 g | Freq: Once | INTRAVENOUS | Status: AC
  Administered 2014-06-07: 1 g via INTRAVENOUS
  Filled 2014-06-07: qty 10

## 2014-06-07 MED ORDER — ONDANSETRON HCL 4 MG/2ML IJ SOLN
4.0000 mg | Freq: Once | INTRAMUSCULAR | Status: AC
  Administered 2014-06-07: 4 mg via INTRAVENOUS
  Filled 2014-06-07: qty 2

## 2014-06-07 NOTE — ED Notes (Signed)
Pt c/o fever of 102 at home all day today. Reports last night she was having some back that continued into today, feels like stabbing. Hx of scoliosis. Took ibuprofen at home early this afternoon, sts she took 3 pills, unsure of what strength they were, denies change in fever after taking them. sts she has also had a HA all day. Denies neck pain/stiff neck. Denies cough/abd pain. Nad, skin warm and dry, resp e/u.

## 2014-06-07 NOTE — ED Provider Notes (Signed)
CSN: [id number]     Arrival date & time 06/07/14  1734 History   First MD Initiated Contact with Patient 06/07/14 1832     Chief Complaint  Patient presents with  . Fever     (Consider location/radiation/quality/duration/timing/severity/associated sxs/prior Treatment) The history is provided by the patient.    Patient presents with right lower back pain and fever.  The pain in her back began two days ago, it is sharp and squeezing, intermittent, no exacerbating or palliative factors.  Has had fever 101-102, chills, myalgias, generalized weakness and fatigue.  Has mild frontal headache.  Has tried lidoderm patch and ibuprofen without improvement.  Pain is 9.5/10 intensity.  Denis neck stiffness or pain, CP, SOB, sore throat, cough, N/V/D, change in bowel habits, urinary symptoms, abnormal vaginal discharge or bleeding.  No known sick contacts.  Denies trauma.  Denies recent illness.   Past Medical History  Diagnosis Date  . Hypoglycemia   . Scoliosis    History reviewed. No pertinent past surgical history. No family history on file. History  Substance Use Topics  . Smoking status: Former Smoker    Types: Cigarettes    Quit date: 06/07/2013  . Smokeless tobacco: Not on file  . Alcohol Use: No   OB History   Grav Para Term Preterm Abortions TAB SAB Ect Mult Living                 Review of Systems  All other systems reviewed and are negative.     Allergies  Review of patient's allergies indicates no known allergies.  Home Medications   Prior to Admission medications   Medication Sig Start Date End Date Taking? Authorizing Provider  ibuprofen (ADVIL,MOTRIN) 200 MG tablet Take 600 mg by mouth every 6 (six) hours as needed for moderate pain.   Yes Historical Provider, MD  IBUPROFEN PO Take 3 tablets by mouth once.   Yes Historical Provider, MD  lidocaine (LIDODERM) 5 % Place 2 patches onto the skin daily. Remove & Discard patch within 12 hours or as directed by MD   Yes  Historical Provider, MD  OVER THE COUNTER MEDICATION Apply 1 application topically daily as needed ("essential oils" for relaxation).   Yes Historical Provider, MD   BP 116/67  Pulse 93  Temp(Src) 100.4 F (38 C) (Oral)  Resp 24  Ht 5\' 8"  (1.727 m)  Wt 135 lb (61.236 kg)  BMI 20.53 kg/m2  SpO2 100%  LMP 06/03/2014 Physical Exam  Nursing note and vitals reviewed. Constitutional: She appears well-developed and well-nourished. No distress.  HENT:  Head: Normocephalic and atraumatic.  Neck: Normal range of motion. Neck supple.  Cardiovascular: Normal rate and regular rhythm.   Pulmonary/Chest: Effort normal and breath sounds normal. No respiratory distress. She has no wheezes. She has no rales.  Abdominal: Soft. She exhibits no distension. There is no tenderness. There is CVA tenderness (right). There is no rebound and no guarding.  Musculoskeletal:       Arms: Neurological: She is alert. She has normal strength. No sensory deficit. GCS eye subscore is 4. GCS verbal subscore is 5. GCS motor subscore is 6.  Skin: She is not diaphoretic.    ED Course  Procedures (including critical care time) Labs Review Labs Reviewed  URINALYSIS, ROUTINE W REFLEX MICROSCOPIC - Abnormal; Notable for the following:    APPearance CLOUDY (*)    Hgb urine dipstick MODERATE (*)    Nitrite POSITIVE (*)    All other components within  normal limits  URINE MICROSCOPIC-ADD ON - Abnormal; Notable for the following:    Bacteria, UA MANY (*)    All other components within normal limits  CBC WITH DIFFERENTIAL - Abnormal; Notable for the following:    Monocytes Relative 14 (*)    All other components within normal limits  COMPREHENSIVE METABOLIC PANEL - Abnormal; Notable for the following:    Total Bilirubin 0.2 (*)    All other components within normal limits  URINE CULTURE  PREGNANCY, URINE  POC URINE PREG, ED    Imaging Review No results found.   EKG Interpretation None      Filed Vitals:    06/07/14 2145  BP: 103/68  Pulse: 105  Temp:   Resp: 19     MDM   Final diagnoses:  Pyelonephritis, acute    Febrile, nontoxic patient with right flank pain.  UA positive for infection - nitrite positive with many bacteria.  Urine culture pending.  Rocephin IV given.. Pt feeling improved with pain and nausea medication.  D/C home with cipro, percocet, zofran.  PCP follow up.  Discussed result, findings, treatment, and follow up  with patient.  Pt given return precautions.  Pt verbalizes understanding and agrees with plan.        Trixie Dredgemily West, PA-C 06/07/14 2326

## 2014-06-07 NOTE — Discharge Instructions (Signed)
Read the information below.  Use the prescribed medication as directed.  Please discuss all new medications with your pharmacist.  Do not take additional tylenol while taking the prescribed pain medication to avoid overdose.  You may return to the Emergency Department at any time for worsening condition or any new symptoms that concern you.  If you develop high fevers, worsening back pain, uncontrolled vomiting, or are unable to tolerate fluids by mouth, return to the ER for a recheck.      Pyelonephritis, Adult Pyelonephritis is a kidney infection. In general, there are 2 main types of pyelonephritis:  Infections that come on quickly without any warning (acute pyelonephritis).  Infections that persist for a long period of time (chronic pyelonephritis). CAUSES  Two main causes of pyelonephritis are:  Bacteria traveling from the bladder to the kidney. This is a problem especially in pregnant women. The urine in the bladder can become filled with bacteria from multiple causes, including:  Inflammation of the prostate gland (prostatitis).  Sexual intercourse in females.  Bladder infection (cystitis).  Bacteria traveling from the bloodstream to the tissue part of the kidney. Problems that may increase your risk of getting a kidney infection include:  Diabetes.  Kidney stones or bladder stones.  Cancer.  Catheters placed in the bladder.  Other abnormalities of the kidney or ureter. SYMPTOMS   Abdominal pain.  Pain in the side or flank area.  Fever.  Chills.  Upset stomach.  Blood in the urine (dark urine).  Frequent urination.  Strong or persistent urge to urinate.  Burning or stinging when urinating. DIAGNOSIS  Your caregiver may diagnose your kidney infection based on your symptoms. A urine sample may also be taken. TREATMENT  In general, treatment depends on how severe the infection is.   If the infection is mild and caught early, your caregiver may treat you  with oral antibiotics and send you home.  If the infection is more severe, the bacteria may have gotten into the bloodstream. This will require intravenous (IV) antibiotics and a hospital stay. Symptoms may include:  High fever.  Severe flank pain.  Shaking chills.  Even after a hospital stay, your caregiver may require you to be on oral antibiotics for a period of time.  Other treatments may be required depending upon the cause of the infection. HOME CARE INSTRUCTIONS   Take your antibiotics as directed. Finish them even if you start to feel better.  Make an appointment to have your urine checked to make sure the infection is gone.  Drink enough fluids to keep your urine clear or pale yellow.  Take medicines for the bladder if you have urgency and frequency of urination as directed by your caregiver. SEEK IMMEDIATE MEDICAL CARE IF:   You have a fever or persistent symptoms for more than 2-3 days.  You have a fever and your symptoms suddenly get worse.  You are unable to take your antibiotics or fluids.  You develop shaking chills.  You experience extreme weakness or fainting.  There is no improvement after 2 days of treatment. MAKE SURE YOU:  Understand these instructions.  Will watch your condition.  Will get help right away if you are not doing well or get worse. Document Released: 12/04/2005 Document Revised: 06/04/2012 Document Reviewed: 05/10/2011 [facility] Patient Information 2015 [place], [place]. This information is not intended to replace advice given to you by your health care provider. Make sure you discuss any questions you have with your health care provider.

## 2014-06-10 LAB — URINE CULTURE

## 2014-06-11 ENCOUNTER — Telehealth (HOSPITAL_COMMUNITY): Payer: Self-pay

## 2014-06-11 NOTE — ED Notes (Signed)
Post ED Visit - Positive Culture Follow-up  Culture report reviewed by antimicrobial stewardship pharmacist: []  Wes Dulaney, Pharm.D., BCPS []  Celedonio MiyamotoJeremy Frens, Pharm.D., BCPS []  Georgina PillionElizabeth Martin, [address]Pharm[address]D., BCPS [x]  NorrisMinh Pham, [address]Pharm[address]D., BCPS, AAHIVP []  Estella HuskMichelle Turner, Pharm.D., BCPS, AAHIVP []  Harvie JuniorNathan Cope, Pharm.D.  Positive urine culture Treated with cipro, organism sensitive to the same and no further patient follow-up is required at this time.  Nicole JacobsFesterman, Nicole [name] 06/11/2014, 12:22 PM

## 2014-06-12 NOTE — ED Provider Notes (Signed)
History/physical exam/procedure(s) were performed by non-physician practitioner and as supervising physician I was immediately available for consultation/collaboration. I have reviewed all notes and am in agreement with care and plan.   Danielle S Ray, MD 06/12/14 1030 

## 2014-11-03 ENCOUNTER — Ambulatory Visit (INDEPENDENT_AMBULATORY_CARE_PROVIDER_SITE_OTHER): Payer: BC Managed Care – PPO | Admitting: Family Medicine

## 2014-11-03 ENCOUNTER — Telehealth: Payer: Self-pay

## 2014-11-03 VITALS — BP 118/76 | HR 84 | Temp 97.8°F | Resp 16 | Ht 69.5 in | Wt 146.0 lb

## 2014-11-03 DIAGNOSIS — H6122 Impacted cerumen, left ear: Secondary | ICD-10-CM

## 2014-11-03 DIAGNOSIS — D709 Neutropenia, unspecified: Secondary | ICD-10-CM

## 2014-11-03 DIAGNOSIS — R1013 Epigastric pain: Secondary | ICD-10-CM

## 2014-11-03 DIAGNOSIS — R197 Diarrhea, unspecified: Secondary | ICD-10-CM

## 2014-11-03 DIAGNOSIS — R112 Nausea with vomiting, unspecified: Secondary | ICD-10-CM

## 2014-11-03 LAB — LIPASE

## 2014-11-03 LAB — POCT CBC
Granulocyte percent: 41.8 %G (ref 37–80)
HEMATOCRIT: 39.1 % (ref 37.7–47.9)
Hemoglobin: 13.1 g/dL (ref 12.2–16.2)
Lymph, poc: 1.2 (ref 0.6–3.4)
MCH: 30.9 pg (ref 27–31.2)
MCHC: 33.5 g/dL (ref 31.8–35.4)
MCV: 92.3 fL (ref 80–97)
MID (CBC): 0.3 (ref 0–0.9)
MPV: 6.3 fL (ref 0–99.8)
PLATELET COUNT, POC: 209 10*3/uL (ref 142–424)
POC Granulocyte: 1.1 — AB (ref 2–6.9)
POC LYMPH %: 47.9 % (ref 10–50)
POC MID %: 10.3 %M (ref 0–12)
RBC: 4.24 M/uL (ref 4.04–5.48)
RDW, POC: 13.7 %
WBC: 2.6 10*3/uL — AB (ref 4.6–10.2)

## 2014-11-03 LAB — COMPREHENSIVE METABOLIC PANEL
ALT: 8 U/L (ref 0–35)
AST: 14 U/L (ref 0–37)
Albumin: 4.3 g/dL (ref 3.5–5.2)
Alkaline Phosphatase: 55 U/L (ref 39–117)
BUN: 7 mg/dL (ref 6–23)
CALCIUM: 9.2 mg/dL (ref 8.4–10.5)
CO2: 24 mEq/L (ref 19–32)
CREATININE: 0.74 mg/dL (ref 0.50–1.10)
Chloride: 106 mEq/L (ref 96–112)
GLUCOSE: 100 mg/dL — AB (ref 70–99)
Potassium: 4.3 mEq/L (ref 3.5–5.3)
Sodium: 138 mEq/L (ref 135–145)
Total Bilirubin: 0.4 mg/dL (ref 0.2–1.2)
Total Protein: 6.9 g/dL (ref 6.0–8.3)

## 2014-11-03 MED ORDER — ONDANSETRON 8 MG PO TBDP
8.0000 mg | ORAL_TABLET | Freq: Three times a day (TID) | ORAL | Status: DC | PRN
Start: 2014-11-03 — End: 2014-12-28

## 2014-11-03 NOTE — Progress Notes (Signed)
Subjective:    Patient ID: Nicole [name], female    DOB: 1991/06/19, 23 y.o.   MRN: [id number]  Nicole EwingHALM, STEVEN, MD  Chief Complaint  Patient presents with  . Emesis    x2 days  . Nausea  . Diarrhea   There are no active problems to display for this patient.  Prior to Admission medications   Medication Sig Start Date End Date Taking? Authorizing Provider  ondansetron (ZOFRAN-ODT) 8 MG disintegrating tablet Take 1 tablet (8 mg total) by mouth every 8 (eight) hours as needed for nausea. 11/03/14   Raelyn Ensignodd Dereon Corkery, PA   Medications, allergies, past medical history, surgical history, family history, social history and problem list reviewed and updated.  HPI  [age] yof with PMH occasional hypoglycemic episodes presents today with N/V, abd pain, and diarrhea that started yest morning.   The abd pain awoke her from sleep at 5am yest morning. The pain is epigastric, constant, rated between 6/10 - 8/10. Currently 6/10. She thinks it occasionally radiates through to her back but not always. She has also been vomiting constantly since yest morning. She thinks approx 10 episodes yest and 5 episodes today. All non bloody, non billious. She has not been able to even keep down fluids yest or today. Two episodes diarrhea yest and 2 more today. Non bloody. She has only eaten meals at home the past 3 days. She has eaten every meal with her son who is not sick. No recent sick contacts. Has not checked her temp at home, no chills.   Last menstrual period 10/20. She is regular. Last sexual intercourse was over 2 months ago. She used condoms.   Denies dysuria, hematuria, flank pain. Dad with PMH pancreatitis (not a heavy drinker). Otherwise no fam hx GI issues.   She denies any CP, SOB, dizziness, syncope, or weakness/numbness/tingling with any extremity. No fam hx AAA, aortic aneurysm or dissection.   Review of Systems See HPI.     Objective:   Physical Exam  Constitutional: She is oriented to person,  place, and time. She appears well-developed and well-nourished.  Non-toxic appearance. She does not have a sickly appearance. She appears ill. No distress.  BP 118/76 mmHg  Pulse 84  Temp(Src) 97.8 F (36.6 C) (Oral)  Resp 16  Ht 5' 9.5" (1.765 m)  Wt 146 lb (66.225 kg)  BMI 21.26 kg/m2  SpO2 99%  LMP 10/06/2014   HENT:  Head: Normocephalic and atraumatic.  Left Ear: Hearing, tympanic membrane, external ear and ear canal normal.  Nose: Nose normal. No mucosal edema or rhinorrhea.  Mouth/Throat: Uvula is midline, oropharynx is clear and moist and mucous membranes are normal. Mucous membranes are not dry. No oropharyngeal exudate, posterior oropharyngeal edema or posterior oropharyngeal erythema.  Left ear cerumen impaction.   Eyes: Conjunctivae and EOM are normal. Pupils are equal, round, and reactive to light.  Cardiovascular: Normal rate, regular rhythm, S1 normal, S2 normal and normal heart sounds.  Exam reveals no gallop.   No murmur heard. Pulmonary/Chest: Effort normal and breath sounds normal. She has no decreased breath sounds. She has no wheezes. She has no rhonchi. She has no rales.  Abdominal: Soft. Normal appearance, normal aorta and bowel sounds are normal. She exhibits no distension, no pulsatile liver and no mass. There is no hepatosplenomegaly. There is tenderness in the epigastric area and periumbilical area. There is CVA tenderness. There is no rigidity, no rebound, no guarding, no tenderness at McBurney's point and negative Murphy's sign.  Mild CVA tenderness on left.   Neurological: She is alert and oriented to person, place, and time.  Skin: Skin is warm and dry. She is not diaphoretic.  Negative turgor.   Psychiatric: She has a normal mood and affect. Her speech is normal.   Left ear flushed: Post flushing no erythema, no fluid.   Results for orders placed or performed in visit on 11/03/14  POCT CBC  Result Value Ref Range   WBC 2.6 (A) 4.6 - 10.2 K/uL   Lymph,  poc 1.2 0.6 - 3.4   POC LYMPH PERCENT 47.9 10 - 50 %L   MID (cbc) 0.3 0 - 0.9   POC MID % 10.3 0 - 12 %M   POC Granulocyte 1.1 (A) 2 - 6.9   Granulocyte percent 41.8 37 - 80 %G   RBC 4.24 4.04 - 5.48 M/uL   Hemoglobin 13.1 12.2 - 16.2 g/dL   HCT, POC 29.539.1 28.437.7 - 47.9 %   MCV 92.3 80 - 97 fL   MCH, POC 30.9 27 - 31.2 pg   MCHC 33.5 31.8 - 35.4 g/dL   RDW, POC 13.213.7 %   Platelet Count, POC 209 142 - 424 K/uL   MPV 6.3 0 - 99.8 fL       Assessment & Plan:   [age] yof with PMH occasional hypoglycemic episodes presents today with N/V, abd pain, and diarrhea that started yest morning.   Abdominal pain, epigastric - Plan: Lipase Diarrhea Non-intractable vomiting with nausea, vomiting of unspecified type - Plan: POCT CBC, Comprehensive metabolic panel --Sx most likely due to viral gastroenteritis. Zofran, fluids, rest. RTC if sx not improving or not able to keep fluids down in 24 hours --Work note given --Concern for pancreatitis low, but with epigastric discomfort and fam hx, ordered lipase --Monitor CMP for any abnormalities --CBC shows no infection today --Concern for dissection low with normal vitals, no CP, lack of risk factors --IV fluids offered but pt declines at this time, plans to start taking zofran and hopefully keep fluids down  Cerumen impaction, left --Flushed  Donnajean Lopesodd M. Michaila Kenney, PA-C Physician Assistant-Certified [facility]  11/03/2014 11:16 AM

## 2014-11-03 NOTE — Telephone Encounter (Signed)
Called and left detailed message.  There is generally not a great pain medication for upset stomach and vomiting.  If she is not doing ok please come back in for a recheck or go to the ER.  Also noted that she is mildly neutropenic.  Will order a future CBC and send letter

## 2014-11-03 NOTE — Telephone Encounter (Signed)
Patient called and states she is experiencing stomach pains. States her stomach hurts really bad right now from all of the throwing up she's done. Wants to know if we can call something for pain to her pharmacy. [facility] on [address]. Please return call and advise. CB # [phone]

## 2014-11-03 NOTE — Patient Instructions (Signed)
Your symptoms of nausea, vomiting, diarrhea, and abdominal pain are most likely due to a viral gastroenteritis.  Please take the zofran as every 8 hours as needed for the nausea/vomiting.  We will contact you with your lab results tomorrow. We are looking to see if there is any infection, and looking at your liver, gallbladder, kidneys, and pancreas. If you do not feel better in 24 hours and are still not able to keep fluids down, please return to clinic.  Try to stay hydrated as best you can, hopefully the zofran allows you to keep fluids down later today. Get plenty of rest.   Viral Gastroenteritis Viral gastroenteritis is also known as stomach flu. This condition affects the stomach and intestinal tract. It can cause sudden diarrhea and vomiting. The illness typically lasts 3 to 8 days. Most people develop an immune response that eventually gets rid of the virus. While this natural response develops, the virus can make you quite ill. CAUSES  Many different viruses can cause gastroenteritis, such as rotavirus or noroviruses. You can catch one of these viruses by consuming contaminated food or water. You may also catch a virus by sharing utensils or other personal items with an infected person or by touching a contaminated surface. SYMPTOMS  The most common symptoms are diarrhea and vomiting. These problems can cause a severe loss of body fluids (dehydration) and a body salt (electrolyte) imbalance. Other symptoms may include:  Fever.  Headache.  Fatigue.  Abdominal pain. DIAGNOSIS  Your caregiver can usually diagnose viral gastroenteritis based on your symptoms and a physical exam. A stool sample may also be taken to test for the presence of viruses or other infections. TREATMENT  This illness typically goes away on its own. Treatments are aimed at rehydration. The most serious cases of viral gastroenteritis involve vomiting so severely that you are not able to keep fluids down. In these  cases, fluids must be given through an intravenous line (IV). HOME CARE INSTRUCTIONS   Drink enough fluids to keep your urine clear or pale yellow. Drink small amounts of fluids frequently and increase the amounts as tolerated.  Ask your caregiver for specific rehydration instructions.  Avoid:  Foods high in sugar.  Alcohol.  Carbonated drinks.  Tobacco.  Juice.  Caffeine drinks.  Extremely hot or cold fluids.  Fatty, greasy foods.  Too much intake of anything at one time.  Dairy products until 24 to 48 hours after diarrhea stops.  You may consume probiotics. Probiotics are active cultures of beneficial bacteria. They may lessen the amount and number of diarrheal stools in adults. Probiotics can be found in yogurt with active cultures and in supplements.  Wash your hands well to avoid spreading the virus.  Only take over-the-counter or prescription medicines for pain, discomfort, or fever as directed by your caregiver. Do not give aspirin to children. Antidiarrheal medicines are not recommended.  Ask your caregiver if you should continue to take your regular prescribed and over-the-counter medicines.  Keep all follow-up appointments as directed by your caregiver. SEEK IMMEDIATE MEDICAL CARE IF:   You are unable to keep fluids down.  You do not urinate at least once every 6 to 8 hours.  You develop shortness of breath.  You notice blood in your stool or vomit. This may look like coffee grounds.  You have abdominal pain that increases or is concentrated in one small area (localized).  You have persistent vomiting or diarrhea.  You have a fever.  The patient  is a child younger than 3 months, and he or she has a fever.  The patient is a child older than 3 months, and he or she has a fever and persistent symptoms.  The patient is a child older than 3 months, and he or she has a fever and symptoms suddenly get worse.  The patient is a baby, and he or she has no  tears when crying. MAKE SURE YOU:   Understand these instructions.  Will watch your condition.  Will get help right away if you are not doing well or get worse. Document Released: 12/04/2005 Document Revised: 02/26/2012 Document Reviewed: 09/20/2011 [facility] Patient Information 2015 [place]ExitCare, [place]LLC. This information is not intended to replace advice given to you by your health care provider. Make sure you discuss any questions you have with your health care provider.

## 2014-11-03 NOTE — Telephone Encounter (Signed)
Pt was seen by you today. Please advise if you would like to send something in for her.

## 2014-12-18 NOTE — L&D Delivery Note (Signed)
Delivery Note  SVD viable female Apgars 8,9 over intact perineum.  Placenta delivered spontaneously intact with 3VC. Good support and hemostasis noted and R/V exam confirms.  PH art was sent.  [place] cord blood was not requested.  Mother and baby were doing well.  EBL 50cc  Candice Camp, MD

## 2014-12-28 ENCOUNTER — Inpatient Hospital Stay (HOSPITAL_COMMUNITY)
Admission: AD | Admit: 2014-12-28 | Discharge: 2014-12-28 | Disposition: A | Discharge status: Home / Self Care | Payer: BLUE CROSS/BLUE SHIELD | Source: Ambulatory Visit | Attending: Obstetrics and Gynecology | Admitting: Obstetrics and Gynecology

## 2014-12-28 ENCOUNTER — Encounter (HOSPITAL_COMMUNITY): Payer: Self-pay

## 2014-12-28 DIAGNOSIS — Z87891 Personal history of nicotine dependence: Secondary | ICD-10-CM | POA: Insufficient documentation

## 2014-12-28 DIAGNOSIS — O210 Mild hyperemesis gravidarum: Secondary | ICD-10-CM | POA: Insufficient documentation

## 2014-12-28 DIAGNOSIS — Z3A01 Less than 8 weeks gestation of pregnancy: Secondary | ICD-10-CM | POA: Diagnosis not present

## 2014-12-28 DIAGNOSIS — O2341 Unspecified infection of urinary tract in pregnancy, first trimester: Secondary | ICD-10-CM | POA: Diagnosis not present

## 2014-12-28 DIAGNOSIS — O219 Vomiting of pregnancy, unspecified: Secondary | ICD-10-CM | POA: Diagnosis not present

## 2014-12-28 DIAGNOSIS — N390 Urinary tract infection, site not specified: Secondary | ICD-10-CM

## 2014-12-28 LAB — URINALYSIS, ROUTINE W REFLEX MICROSCOPIC
Bilirubin Urine: NEGATIVE
GLUCOSE, UA: NEGATIVE mg/dL
Hgb urine dipstick: NEGATIVE
Ketones, ur: NEGATIVE mg/dL
LEUKOCYTES UA: NEGATIVE
NITRITE: POSITIVE — AB
PH: 6 (ref 5.0–8.0)
PROTEIN: NEGATIVE mg/dL
Specific Gravity, Urine: 1.015 (ref 1.005–1.030)
Urobilinogen, UA: 0.2 mg/dL (ref 0.0–1.0)

## 2014-12-28 LAB — URINE MICROSCOPIC-ADD ON

## 2014-12-28 LAB — GLUCOSE, CAPILLARY: GLUCOSE-CAPILLARY: 85 mg/dL (ref 70–99)

## 2014-12-28 LAB — POCT PREGNANCY, URINE: PREG TEST UR: POSITIVE — AB

## 2014-12-28 MED ORDER — CEPHALEXIN 500 MG PO CAPS: 500.0000 mg | ORAL_CAPSULE | Freq: Four times a day (QID) | ORAL | Status: DC

## 2014-12-28 MED ORDER — LACTATED RINGERS IV SOLN
INTRAVENOUS | Status: DC
  Administered 2014-12-28: 15:00:00 via INTRAVENOUS

## 2014-12-28 MED ORDER — PROMETHAZINE HCL 25 MG/ML IJ SOLN
12.5000 mg | Freq: Once | INTRAMUSCULAR | Status: AC
  Administered 2014-12-28: 12.5 mg via INTRAVENOUS
  Filled 2014-12-28: qty 1

## 2014-12-28 MED ORDER — PROMETHAZINE HCL 25 MG PO TABS: 12.5000 mg | ORAL_TABLET | Freq: Four times a day (QID) | ORAL | Status: DC | PRN

## 2014-12-28 NOTE — MAU Note (Signed)
Pt states here for nausea and vomiting x4 today. Felt like she would pass out earlier, however did not. LMP- 11/01/2014. Denies bleeding or vag d/c changes. Denies pain.

## 2014-12-28 NOTE — MAU Note (Signed)
Urine in lab 

## 2014-12-28 NOTE — MAU Provider Note (Signed)
History     CSN: [id number]  Arrival date and time: 12/28/14 1202   First Provider Initiated Contact with Patient 12/28/14 1404      Chief Complaint  Patient presents with  . Emesis   HPI   Ms. Sebastiana Wuest is a 24 y.o. female G2P1001 at [redacted]w[redacted]d who presents with N/V. The symptoms started yesterday; she was sick all day and then when she got up this morning she vomited and felt like she was going to pass out.   Currently she is not nauseated, however does feel lightheaded.  She has a history of hypoglycemia.  Patient is taking diclegis   OB History    Gravida Para Term Preterm AB TAB SAB Ectopic Multiple Living   Past Medical History  Diagnosis Date  . Hypoglycemia   . Scoliosis     Past Surgical History  Procedure Laterality Date  . Mouth surgery      History reviewed. No pertinent family history.  History  Substance Use Topics  . Smoking status: Former Smoker    Types: Cigarettes    Quit date: 06/07/2013  . Smokeless tobacco: Not on file  . Alcohol Use: No    Allergies: No Known Allergies  Prescriptions prior to admission  Medication Sig Dispense Refill Last Dose  . ondansetron (ZOFRAN-ODT) 8 MG disintegrating tablet Take 1 tablet (8 mg total) by mouth every 8 (eight) hours as needed for nausea. 30 tablet 0    Results for orders placed or performed during the hospital encounter of 12/28/14 (from the past 48 hour(s))  Urinalysis, Routine w reflex microscopic     Status: Abnormal   Collection Time: 12/28/14  1:01 PM  Result Value Ref Range   Color, Urine YELLOW YELLOW   APPearance CLEAR CLEAR   Specific Gravity, Urine 1.015 1.005 - 1.030   pH 6.0 5.0 - 8.0   Glucose, UA NEGATIVE NEGATIVE mg/dL   Hgb urine dipstick NEGATIVE NEGATIVE   Bilirubin Urine NEGATIVE NEGATIVE   Ketones, ur NEGATIVE NEGATIVE mg/dL   Protein, ur NEGATIVE NEGATIVE mg/dL   Urobilinogen, UA 0.2 0.0 - 1.0 mg/dL   Nitrite POSITIVE (A) NEGATIVE   Leukocytes, UA  NEGATIVE NEGATIVE  Urine microscopic-add on     Status: Abnormal   Collection Time: 12/28/14  1:01 PM  Result Value Ref Range   Squamous Epithelial / LPF RARE RARE   WBC, UA 0-2 <3 WBC/hpf   RBC / HPF 0-2 <3 RBC/hpf   Bacteria, UA MANY (A) RARE  Pregnancy, urine POC     Status: Abnormal   Collection Time: 12/28/14  1:06 PM  Result Value Ref Range   Preg Test, Ur POSITIVE (A) NEGATIVE    Comment:        THE SENSITIVITY OF THIS METHODOLOGY IS >24 mIU/mL   Glucose, capillary     Status: None   Collection Time: 12/28/14  2:37 PM  Result Value Ref Range   Glucose-Capillary 85 70 - 99 mg/dL     Review of Systems  Constitutional: Positive for chills. Negative for fever.  Gastrointestinal: Positive for constipation. Negative for abdominal pain.  Genitourinary: Positive for dysuria. Negative for flank pain.       Denies vaginal bleeding   Musculoskeletal: Negative for back pain.  Neurological: Positive for dizziness.   Physical Exam   Blood pressure 108/63, pulse 79, temperature 98.7 F (37.1 C), temperature source Oral, resp. rate  16, height 5\' 9"  (1.753 m), weight 62.869 kg (138 lb 9.6 oz), last menstrual period 11/01/2014.  Physical Exam  Nursing note and vitals reviewed. Constitutional: She is oriented to person, place, and time. Vital signs are normal. She appears well-developed and well-nourished. She has a sickly appearance. No distress.  HENT:  Head: Normocephalic.  Eyes: Pupils are equal, round, and reactive to light.  Neck: Neck supple.  Cardiovascular: Normal rate and normal heart sounds.   Respiratory: Effort normal.  GI: Normal appearance and bowel sounds are normal. There is no CVA tenderness.  Musculoskeletal: Normal range of motion.  Neurological: She is alert and oriented to person, place, and time.  Skin: Skin is warm. She is not diaphoretic.  Psychiatric: Her behavior is normal.    MAU Course  Procedures  None  MDM Orthostatic BP readings;  significant increase in heart rate when patient stood.  Phenergan 12.5 mg  LR liter bolus CBG Urine culture pending  Discussed patient with Dr. Henderson Cloudomblin. Ok to sent the patient home with keflex for UTI and phenergan suppositories.  I will RX phenergan Pills and I instructed the patient that she can use as a suppository if vomiting.   CBG: 85  Patient tolerated fluids and crackers in MAU  Assessment and Plan    A:  Nausea and vomiting in pregnancy UTI   P:  Discharge home in stable condition RX: phenergan, keflex Follow up with Dr. Henderson Cloudomblin as scheduled Urine culture pending  Return to MAU if symptoms worsen BRAT diet  Increase fluids slowly  Change positions slowly   Iona HansenJennifer Irene Lachanda Buczek, NP 12/28/2014 4:45 PM

## 2014-12-30 LAB — CULTURE, OB URINE
Colony Count: 50000
Special Requests: NORMAL

## 2014-12-30 LAB — OB RESULTS CONSOLE ABO/RH: RH Type: POSITIVE

## 2014-12-30 LAB — OB RESULTS CONSOLE ANTIBODY SCREEN: Antibody Screen: NEGATIVE

## 2014-12-30 LAB — OB RESULTS CONSOLE HIV ANTIBODY (ROUTINE TESTING): HIV: NONREACTIVE

## 2014-12-30 LAB — OB RESULTS CONSOLE HEPATITIS B SURFACE ANTIGEN: HEP B S AG: NEGATIVE

## 2014-12-30 LAB — OB RESULTS CONSOLE RPR: RPR: NONREACTIVE

## 2014-12-30 LAB — OB RESULTS CONSOLE RUBELLA ANTIBODY, IGM: RUBELLA: IMMUNE

## 2015-01-06 ENCOUNTER — Telehealth: Payer: Self-pay | Admitting: Cardiology

## 2015-01-06 NOTE — Telephone Encounter (Signed)
10 pages of records received from [facility]: given to the chart prep team on 1.20.16:djc

## 2015-01-12 ENCOUNTER — Encounter (HOSPITAL_COMMUNITY): Payer: Self-pay | Admitting: *Deleted

## 2015-01-12 ENCOUNTER — Emergency Department (HOSPITAL_COMMUNITY)
Admission: EM | Admit: 2015-01-12 | Discharge: 2015-01-12 | Disposition: A | Discharge status: Home / Self Care | Payer: BLUE CROSS/BLUE SHIELD | Attending: Emergency Medicine | Admitting: Emergency Medicine

## 2015-01-12 DIAGNOSIS — Z792 Long term (current) use of antibiotics: Secondary | ICD-10-CM | POA: Diagnosis not present

## 2015-01-12 DIAGNOSIS — K088 Other specified disorders of teeth and supporting structures: Secondary | ICD-10-CM | POA: Diagnosis not present

## 2015-01-12 DIAGNOSIS — R51 Headache: Secondary | ICD-10-CM | POA: Diagnosis present

## 2015-01-12 DIAGNOSIS — K0889 Other specified disorders of teeth and supporting structures: Secondary | ICD-10-CM

## 2015-01-12 DIAGNOSIS — Z8639 Personal history of other endocrine, nutritional and metabolic disease: Secondary | ICD-10-CM | POA: Insufficient documentation

## 2015-01-12 DIAGNOSIS — Z87891 Personal history of nicotine dependence: Secondary | ICD-10-CM | POA: Diagnosis not present

## 2015-01-12 DIAGNOSIS — Z79899 Other long term (current) drug therapy: Secondary | ICD-10-CM | POA: Diagnosis not present

## 2015-01-12 DIAGNOSIS — Z8739 Personal history of other diseases of the musculoskeletal system and connective tissue: Secondary | ICD-10-CM | POA: Diagnosis not present

## 2015-01-12 MED ORDER — PENICILLIN V POTASSIUM 250 MG PO TABS: 250.0000 mg | ORAL_TABLET | Freq: Four times a day (QID) | ORAL | Status: AC

## 2015-01-12 NOTE — ED Notes (Signed)
Pt is here with right facial pain since this am and states the pain radiates only on right side of head.  Hurts when she bites teeth together.  No vision change or facial deficits noted.

## 2015-01-12 NOTE — Discharge Instructions (Signed)

## 2015-01-12 NOTE — ED Provider Notes (Signed)
CSN: [id number]     Arrival date & time 01/12/15  1740 History   First MD Initiated Contact with Patient 01/12/15 1822     This chart was scribed for non-physician practitioner, Roxy Horsemanobert Jasmon Graffam PA-C working with Purvis SheffieldForrest Harrison, MD by Arlan OrganAshley Leger, ED Scribe. This patient was seen in room TR07C/TR07C and the patient's care was started at 7:25 PM.   Chief Complaint  Patient presents with  . Facial Pain   HPI  HPI Comments: Nicole [name] is a 24 y.o. female who presents to the Emergency Department complaining of constant, moderate R sided facial pain onset this morning. Pt states pain is deep within her R eye and extends into the cheek and jaw. Pain is exacerbated with palpation and chewing. She has not tried any OTC medications or home remedies to help manage symptoms. She denies any fever, chills, nausea, vomiting, diarrhea, CP, or SOB. Pt states she has a URI last week and recently recovered from symptoms. No known allergies to medications.  Past Medical History  Diagnosis Date  . Hypoglycemia   . Scoliosis    Past Surgical History  Procedure Laterality Date  . Mouth surgery     No family history on file. History  Substance Use Topics  . Smoking status: Former Smoker    Types: Cigarettes    Quit date: 06/07/2013  . Smokeless tobacco: Not on file  . Alcohol Use: No   OB History    Gravida Para Term Preterm AB TAB SAB Ectopic Multiple Living   2 1 1       1      Review of Systems  Constitutional: Negative for fever and chills.  HENT: Positive for dental problem. Negative for drooling.        R sided facial pain  Eyes: Negative for pain.  Respiratory: Negative for shortness of breath.   Cardiovascular: Negative for chest pain.  Gastrointestinal: Negative for nausea, vomiting, abdominal pain and diarrhea.  Neurological: Negative for speech difficulty.  Psychiatric/Behavioral: Negative for sleep disturbance.      Allergies  Review of patient's allergies indicates no  known allergies.  Home Medications   Prior to Admission medications   Medication Sig Start Date End Date Taking? Authorizing Provider  cephALEXin (KEFLEX) 500 MG capsule Take 1 capsule (500 mg total) by mouth 4 (four) times daily. 12/28/14   Iona HansenJennifer Irene Rasch, NP  Doxylamine-Pyridoxine (DICLEGIS PO) Take 2 capsules by mouth at bedtime.     Historical Provider, MD  Doxylamine-Pyridoxine (DICLEGIS PO) Take 1 capsule by mouth 2 (two) times daily.    Historical Provider, MD  promethazine (PHENERGAN) 25 MG tablet Take 0.5 tablets (12.5 mg total) by mouth every 6 (six) hours as needed for nausea or vomiting. 12/28/14   Debbrah AlarJennifer Irene Rasch, NP   Triage Vitals: BP 117/57 mmHg  Pulse 100  Temp(Src) 99 F (37.2 C) (Oral)  Resp 18  SpO2 100%  LMP 11/01/2014 (Approximate)   Physical Exam  Constitutional: She is oriented to person, place, and time. She appears well-developed and well-nourished.  HENT:  Head: Normocephalic and atraumatic.  Right Ear: External ear normal.  Left Ear: External ear normal.  Nose: Nose normal.  Mouth/Throat: Oropharynx is clear and moist. No oropharyngeal exudate.    Poor dentition throughout.  Affected tooth as diagrammed.  No signs of peritonsillar or tonsillar abscess.  No signs of gingival abscess. Oropharynx is clear and without exudates.  Uvula is midline.  Airway is intact. No signs of Ludwig's angina with  palpation of oral and sublingual mucosa.   No evidence of preseptal cellulitis, no entrapment  Eyes: Conjunctivae and EOM are normal.  Neck: Normal range of motion.  Cardiovascular: Normal rate, regular rhythm and normal heart sounds.   Pulmonary/Chest: Effort normal and breath sounds normal. No respiratory distress. She has no wheezes.  Abdominal: She exhibits no distension.  Musculoskeletal: Normal range of motion.  Neurological: She is alert and oriented to person, place, and time.  Skin: Skin is dry.  Psychiatric: She has a normal mood and  affect. Her behavior is normal. Judgment and thought content normal.  Nursing note and vitals reviewed.   ED Course  Procedures (including critical care time)  DIAGNOSTIC STUDIES: Oxygen Saturation is 100% on RA, Normal by my interpretation.    COORDINATION OF CARE: 7:32 PM-Discussed treatment plan with pt at bedside and pt agreed to plan.  a   Labs Review Labs Reviewed - No data to display  Imaging Review No results found.   EKG Interpretation None      MDM   Final diagnoses:  Pain, dental    Patient with toothache and face pain. No evidence of cellulitis. No changes in vision.  No gross abscess.  Exam unconcerning for Ludwig's angina or spread of infection.  Will treat with penicillin and pain medicine.  Urged patient to follow-up with dentist.    I personally performed the services described in this documentation, which was scribed in my presence. The recorded information has been reviewed and is accurate.    Roxy Horseman, PA-C 01/12/15 1950  Purvis Sheffield, MD 01/13/15 [phone]

## 2015-01-21 ENCOUNTER — Other Ambulatory Visit (HOSPITAL_COMMUNITY): Payer: Self-pay | Admitting: Obstetrics and Gynecology

## 2015-01-21 DIAGNOSIS — Z0489 Encounter for examination and observation for other specified reasons: Secondary | ICD-10-CM

## 2015-01-21 DIAGNOSIS — O09292 Supervision of pregnancy with other poor reproductive or obstetric history, second trimester: Secondary | ICD-10-CM

## 2015-01-21 DIAGNOSIS — IMO0002 Reserved for concepts with insufficient information to code with codable children: Secondary | ICD-10-CM

## 2015-01-25 ENCOUNTER — Ambulatory Visit: Payer: BLUE CROSS/BLUE SHIELD | Admitting: Cardiology

## 2015-01-27 ENCOUNTER — Encounter: Payer: Self-pay | Admitting: Cardiology

## 2015-02-04 ENCOUNTER — Inpatient Hospital Stay (HOSPITAL_COMMUNITY)
Admission: AD | Admit: 2015-02-04 | Discharge: 2015-02-04 | Disposition: A | Discharge status: Home / Self Care | Payer: BLUE CROSS/BLUE SHIELD | Source: Ambulatory Visit | Attending: Obstetrics and Gynecology | Admitting: Obstetrics and Gynecology

## 2015-02-04 ENCOUNTER — Encounter (HOSPITAL_COMMUNITY): Payer: Self-pay | Admitting: *Deleted

## 2015-02-04 DIAGNOSIS — O9989 Other specified diseases and conditions complicating pregnancy, childbirth and the puerperium: Secondary | ICD-10-CM | POA: Insufficient documentation

## 2015-02-04 DIAGNOSIS — N949 Unspecified condition associated with female genital organs and menstrual cycle: Secondary | ICD-10-CM | POA: Diagnosis not present

## 2015-02-04 DIAGNOSIS — Z87891 Personal history of nicotine dependence: Secondary | ICD-10-CM | POA: Insufficient documentation

## 2015-02-04 DIAGNOSIS — Z3A12 12 weeks gestation of pregnancy: Secondary | ICD-10-CM | POA: Diagnosis not present

## 2015-02-04 LAB — CBC WITH DIFFERENTIAL/PLATELET
BASOS ABS: 0 10*3/uL (ref 0.0–0.1)
Basophils Relative: 0 % (ref 0–1)
EOS PCT: 2 % (ref 0–5)
Eosinophils Absolute: 0.2 10*3/uL (ref 0.0–0.7)
HCT: 29.1 % — ABNORMAL LOW (ref 36.0–46.0)
HEMOGLOBIN: 10.3 g/dL — AB (ref 12.0–15.0)
LYMPHS ABS: 2.5 10*3/uL (ref 0.7–4.0)
LYMPHS PCT: 31 % (ref 12–46)
MCH: 30.8 pg (ref 26.0–34.0)
MCHC: 35.4 g/dL (ref 30.0–36.0)
MCV: 87.1 fL (ref 78.0–100.0)
Monocytes Absolute: 0.5 10*3/uL (ref 0.1–1.0)
Monocytes Relative: 6 % (ref 3–12)
NEUTROS ABS: 4.9 10*3/uL (ref 1.7–7.7)
NEUTROS PCT: 61 % (ref 43–77)
Platelets: 222 10*3/uL (ref 150–400)
RBC: 3.34 MIL/uL — ABNORMAL LOW (ref 3.87–5.11)
RDW: 13 % (ref 11.5–15.5)
WBC: 8.1 10*3/uL (ref 4.0–10.5)

## 2015-02-04 LAB — URINALYSIS, ROUTINE W REFLEX MICROSCOPIC
BILIRUBIN URINE: NEGATIVE
GLUCOSE, UA: NEGATIVE mg/dL
HGB URINE DIPSTICK: NEGATIVE
KETONES UR: NEGATIVE mg/dL
Leukocytes, UA: NEGATIVE
Nitrite: NEGATIVE
PROTEIN: NEGATIVE mg/dL
Specific Gravity, Urine: 1.02 (ref 1.005–1.030)
Urobilinogen, UA: 0.2 mg/dL (ref 0.0–1.0)
pH: 6.5 (ref 5.0–8.0)

## 2015-02-04 MED ORDER — ACETAMINOPHEN 500 MG PO TABS
1000.0000 mg | ORAL_TABLET | Freq: Once | ORAL | Status: AC
  Administered 2015-02-04: 1000 mg via ORAL
  Filled 2015-02-04: qty 2

## 2015-02-04 NOTE — Discharge Instructions (Signed)
Abdominal Pain During Pregnancy °Belly (abdominal) pain is common during pregnancy. Most of the time, it is not a serious problem. Other times, it can be a sign that something is wrong with the pregnancy. Always tell your doctor if you have belly pain. °HOME CARE °Monitor your belly pain for any changes. The following actions may help you feel better: °· Do not have sex (intercourse) or put anything in your vagina until you feel better. °· Rest until your pain stops. °· Drink clear fluids if you feel sick to your stomach (nauseous). Do not eat solid food until you feel better. °· Only take medicine as told by your doctor. °· Keep all doctor visits as told. °GET HELP RIGHT AWAY IF:  °· You are bleeding, leaking fluid, or pieces of tissue come out of your vagina. °· You have more pain or cramping. °· You keep throwing up (vomiting). °· You have pain when you pee (urinate) or have blood in your pee. °· You have a fever. °· You do not feel your baby moving as much. °· You feel very weak or feel like passing out. °· You have trouble breathing, with or without belly pain. °· You have a very bad headache and belly pain. °· You have fluid leaking from your vagina and belly pain. °· You keep having watery poop (diarrhea). °· Your belly pain does not go away after resting, or the pain gets worse. °MAKE SURE YOU:  °· Understand these instructions. °· Will watch your condition. °· Will get help right away if you are not doing well or get worse. °Document Released: 11/22/2009 Document Revised: 08/06/2013 Document Reviewed: 07/03/2013 °[employer]® Patient Information ©2015 [employer]. This information is not intended to replace advice given to you by your health care provider. Make sure you discuss any questions you have with your health care provider. ° °

## 2015-02-04 NOTE — MAU Note (Signed)
Pt states she started having pain  02/03/2015 and she waited went to sleep and when she woke up last this evening she was still having pain

## 2015-02-04 NOTE — MAU Provider Note (Signed)
History     CSN: [id number]  Arrival date and time: 02/04/15 2101   First Provider Initiated Contact with Patient 02/04/15 2129      Chief Complaint  Patient presents with  . Pelvic Pain   HPI  [name] is a 24 y.o. G2P1001 at [redacted]w[redacted]d who presents to MAU today with complaint of pelvic pain since yesterday. She states worse in the evening after being more active at work. Pain is suprapubic and just right of midline more than left. She has had nausea without vomiting, diarrhea or constipation. She denies vaginal bleeding, discharge, fever or UTI symptoms.    OB History    Gravida Para Term Preterm AB TAB SAB Ectopic Multiple Living   Past Medical History  Diagnosis Date  . Hypoglycemia   . Scoliosis     Past Surgical History  Procedure Laterality Date  . Mouth surgery      History reviewed. No pertinent family history.  History  Substance Use Topics  . Smoking status: Former Smoker    Types: Cigarettes    Quit date: 06/07/2013  . Smokeless tobacco: Not on file  . Alcohol Use: No    Allergies: No Known Allergies  Prescriptions prior to admission  Medication Sig Dispense Refill Last Dose  . Doxylamine-Pyridoxine (DICLEGIS PO) Take 2 capsules by mouth at bedtime.    02/03/2015 at Unknown time  . promethazine (PHENERGAN) 25 MG tablet Take 0.5 tablets (12.5 mg total) by mouth every 6 (six) hours as needed for nausea or vomiting. 30 tablet 0 Past Week at Unknown time  . cephALEXin (KEFLEX) 500 MG capsule Take 1 capsule (500 mg total) by mouth 4 (four) times daily. 20 capsule 0   . Doxylamine-Pyridoxine (DICLEGIS PO) Take 1 capsule by mouth 2 (two) times daily.   12/28/2014 at 0900    Review of Systems  Constitutional: Negative for fever and malaise/fatigue.  Gastrointestinal: Positive for nausea and abdominal pain. Negative for vomiting, diarrhea and constipation.  Genitourinary: Negative for dysuria, urgency and frequency.       Neg -  vaginal bleeding, discharge   Physical Exam   Blood pressure 103/63, pulse 81, temperature 98.6 F (37 C), temperature source Oral, resp. rate 18, last menstrual period 11/01/2014.  Physical Exam  Constitutional: She is oriented to person, place, and time. She appears well-developed and well-nourished. No distress.  HENT:  Head: Normocephalic.  Cardiovascular: Normal rate.   Respiratory: Effort normal.  GI: Soft. Bowel sounds are normal. She exhibits no distension and no mass. There is tenderness (mild tenderness to palpation of the suprapubic region just right of midline). There is no rebound and no guarding.  Neurological: She is alert and oriented to person, place, and time.  Skin: Skin is warm and dry. No erythema.  Psychiatric: She has a normal mood and affect.   Results for orders placed or performed during the hospital encounter of 02/04/15 (from the past 24 hour(s))  Urinalysis, Routine w reflex microscopic     Status: None   Collection Time: 02/04/15  9:10 PM  Result Value Ref Range   Color, Urine YELLOW YELLOW   APPearance CLEAR CLEAR   Specific Gravity, Urine 1.020 1.005 - 1.030   pH 6.5 5.0 - 8.0   Glucose, UA NEGATIVE NEGATIVE mg/dL   Hgb urine dipstick NEGATIVE NEGATIVE   Bilirubin Urine NEGATIVE NEGATIVE   Ketones, ur NEGATIVE NEGATIVE mg/dL  Protein, ur NEGATIVE NEGATIVE mg/dL   Urobilinogen, UA 0.2 0.0 - 1.0 mg/dL   Nitrite NEGATIVE NEGATIVE   Leukocytes, UA NEGATIVE NEGATIVE    MAU Course  Procedures None  MDM FHR- 160 bpm with doppler Discussed with Dr. Arelia SneddonMcComb. Ok for discharge and follow-up as scheduled  Assessment and Plan  A: SIUP at 3836w5d Round ligament pain  P: Discharge home Advised Tylenol PRN for pain Discussed use of abdominal pain and warm bath/shower PRN for pain First trimester warning signs discussed Patient advised to follow-up in the office as scheduled or sooner PRN Patient may return to MAU as needed or if her condition were  to change or worsen   Marny LowensteinJulie N Artist Bloom, PA-C  02/04/2015, 9:30 PM

## 2015-02-07 LAB — OB RESULTS CONSOLE GC/CHLAMYDIA
Chlamydia: NEGATIVE
GC PROBE AMP, GENITAL: NEGATIVE

## 2015-02-18 ENCOUNTER — Inpatient Hospital Stay (HOSPITAL_COMMUNITY)
Admission: AD | Admit: 2015-02-18 | Discharge: 2015-02-18 | Disposition: A | Discharge status: Home / Self Care | Payer: BLUE CROSS/BLUE SHIELD | Source: Ambulatory Visit | Attending: Obstetrics and Gynecology | Admitting: Obstetrics and Gynecology

## 2015-02-18 ENCOUNTER — Encounter (HOSPITAL_COMMUNITY): Payer: Self-pay | Admitting: *Deleted

## 2015-02-18 DIAGNOSIS — Z3A14 14 weeks gestation of pregnancy: Secondary | ICD-10-CM | POA: Diagnosis not present

## 2015-02-18 DIAGNOSIS — Z720 Tobacco use: Secondary | ICD-10-CM | POA: Diagnosis not present

## 2015-02-18 DIAGNOSIS — R42 Dizziness and giddiness: Secondary | ICD-10-CM | POA: Insufficient documentation

## 2015-02-18 DIAGNOSIS — O26892 Other specified pregnancy related conditions, second trimester: Secondary | ICD-10-CM | POA: Diagnosis not present

## 2015-02-18 DIAGNOSIS — O9989 Other specified diseases and conditions complicating pregnancy, childbirth and the puerperium: Secondary | ICD-10-CM

## 2015-02-18 DIAGNOSIS — R1031 Right lower quadrant pain: Secondary | ICD-10-CM | POA: Insufficient documentation

## 2015-02-18 HISTORY — DX: Syncope and collapse: R55

## 2015-02-18 HISTORY — DX: Major depressive disorder, single episode, unspecified: F32.9

## 2015-02-18 HISTORY — DX: Depression, unspecified: F32.A

## 2015-02-18 LAB — URINALYSIS, ROUTINE W REFLEX MICROSCOPIC
BILIRUBIN URINE: NEGATIVE
Glucose, UA: NEGATIVE mg/dL
Hgb urine dipstick: NEGATIVE
KETONES UR: NEGATIVE mg/dL
Leukocytes, UA: NEGATIVE
Nitrite: NEGATIVE
PROTEIN: NEGATIVE mg/dL
Specific Gravity, Urine: 1.025 (ref 1.005–1.030)
Urobilinogen, UA: 0.2 mg/dL (ref 0.0–1.0)
pH: 6 (ref 5.0–8.0)

## 2015-02-18 LAB — COMPREHENSIVE METABOLIC PANEL
ALT: 10 U/L (ref 0–35)
AST: 13 U/L (ref 0–37)
Albumin: 3.8 g/dL (ref 3.5–5.2)
Alkaline Phosphatase: 37 U/L — ABNORMAL LOW (ref 39–117)
Anion gap: 7 (ref 5–15)
BUN: 7 mg/dL (ref 6–23)
CO2: 24 mmol/L (ref 19–32)
Calcium: 8.9 mg/dL (ref 8.4–10.5)
Chloride: 106 mmol/L (ref 96–112)
Creatinine, Ser: 0.48 mg/dL — ABNORMAL LOW (ref 0.50–1.10)
GFR calc Af Amer: >90 mL/min (ref >90)
GFR calc non Af Amer: >90 mL/min (ref >90)
Glucose, Bld: 84 mg/dL (ref 70–99)
Potassium: 3.8 mmol/L (ref 3.5–5.1)
Sodium: 137 mmol/L (ref 135–145)
Total Bilirubin: 0.5 mg/dL (ref 0.3–1.2)
Total Protein: 6.4 g/dL (ref 6.0–8.3)

## 2015-02-18 LAB — CBC
HCT: 31.2 % — ABNORMAL LOW (ref 36.0–46.0)
Hemoglobin: 10.9 g/dL — ABNORMAL LOW (ref 12.0–15.0)
MCH: 30.8 pg (ref 26.0–34.0)
MCHC: 34.9 g/dL (ref 30.0–36.0)
MCV: 88.1 fL (ref 78.0–100.0)
Platelets: 241 10*3/uL (ref 150–400)
RBC: 3.54 MIL/uL — ABNORMAL LOW (ref 3.87–5.11)
RDW: 13.1 % (ref 11.5–15.5)
WBC: 6.4 10*3/uL (ref 4.0–10.5)

## 2015-02-18 LAB — GLUCOSE, CAPILLARY: GLUCOSE-CAPILLARY: 73 mg/dL (ref 70–99)

## 2015-02-18 NOTE — MAU Note (Signed)
Went to work, suddenly became light headed and dizzy. Has some pain in RLQ

## 2015-02-18 NOTE — MAU Provider Note (Signed)
History     CSN: [id number]  Arrival date and time: 02/18/15 1142   None     Chief Complaint  Patient presents with  . Dizziness  . Abdominal Pain   HPI   24 y.o. G2P1001  presents to the MAU with complaint of dizziness and rlq pain.  Past Medical History  Diagnosis Date  . Hypoglycemia   . Scoliosis   . Syncope   . Infection     UTI  . Depression     hx age 30, fine now    Past Surgical History  Procedure Laterality Date  . Mouth surgery      Family History  Problem Relation Age of Onset  . Cancer Mother 80    breast  . Cancer Maternal Grandmother     lung  . Diabetes Paternal Grandfather     History  Substance Use Topics  . Smoking status: Former Smoker    Types: Cigarettes    Quit date: 06/07/2013  . Smokeless tobacco: Never Used  . Alcohol Use: No     Comment: occ, prior to preg    Allergies: No Known Allergies  Prescriptions prior to admission  Medication Sig Dispense Refill Last Dose  . Prenatal Vit-Fe Fumarate-FA (PRENATAL MULTIVITAMIN) TABS tablet Take 1 tablet by mouth daily at 12 noon.   02/17/2015 at Unknown time  . promethazine (PHENERGAN) 25 MG tablet Take 0.5 tablets (12.5 mg total) by mouth every 6 (six) hours as needed for nausea or vomiting. 30 tablet 0 Past Month at Unknown time    Review of Systems  Constitutional: Negative for fever and chills.  Eyes: Negative for blurred vision and double vision.  Respiratory: Negative for cough and shortness of breath.   Cardiovascular: Negative for chest pain and palpitations.  Gastrointestinal: Positive for abdominal pain. Negative for heartburn, nausea and vomiting.  Genitourinary: Negative for dysuria, urgency and frequency.  Musculoskeletal: Negative.   Neurological: Positive for dizziness. Negative for headaches.  Endo/Heme/Allergies: Negative.    Physical Exam   Blood pressure 113/71, pulse 116, temperature 99.2 F (37.3 C), temperature source Oral, resp. rate 18, last  menstrual period 11/01/2014.  HR with pulse ox -80  Physical Exam  Constitutional: She is oriented to person, place, and time. She appears well-developed and well-nourished. No distress.  HENT:  Head: Normocephalic and atraumatic.  Neck: Normal range of motion.  Cardiovascular: Normal rate.   Respiratory: No respiratory distress.  GI: Soft. She exhibits no distension and no mass. There is tenderness. There is no rebound and no guarding.  Musculoskeletal: Normal range of motion.  Neurological: She is alert and oriented to person, place, and time.  Skin: Skin is warm and dry.  Psychiatric: She has a normal mood and affect. Her behavior is normal. Judgment and thought content normal.    MAU Course  Procedures  Results for orders placed or performed during the hospital encounter of 02/18/15 (from the past 24 hour(s))  Urinalysis, Routine w reflex microscopic     Status: Abnormal   Collection Time: 02/18/15 11:52 AM  Result Value Ref Range   Color, Urine YELLOW YELLOW   APPearance HAZY (A) CLEAR   Specific Gravity, Urine 1.025 1.005 - 1.030   pH 6.0 5.0 - 8.0   Glucose, UA NEGATIVE NEGATIVE mg/dL   Hgb urine dipstick NEGATIVE NEGATIVE   Bilirubin Urine NEGATIVE NEGATIVE   Ketones, ur NEGATIVE NEGATIVE mg/dL   Protein, ur NEGATIVE NEGATIVE mg/dL   Urobilinogen, UA 0.2 0.0 - 1.0  mg/dL   Nitrite NEGATIVE NEGATIVE   Leukocytes, UA NEGATIVE NEGATIVE  Glucose, capillary     Status: None   Collection Time: 02/18/15 12:51 PM  Result Value Ref Range   Glucose-Capillary 73 70 - 99 mg/dL  CBC     Status: Abnormal   Collection Time: 02/18/15  1:09 PM  Result Value Ref Range   WBC 6.4 4.0 - 10.5 K/uL   RBC 3.54 (L) 3.87 - 5.11 MIL/uL   Hemoglobin 10.9 (L) 12.0 - 15.0 g/dL   HCT 16.131.2 (L) 09.636.0 - 04.546.0 %   MCV 88.1 78.0 - 100.0 fL   MCH 30.8 26.0 - 34.0 pg   MCHC 34.9 30.0 - 36.0 g/dL   RDW 40.913.1 81.111.5 - 91.415.5 %   Platelets 241 150 - 400 K/uL   CMP     Component Value Date/Time   NA  137 02/18/2015 1309   K 3.8 02/18/2015 1309   CL 106 02/18/2015 1309   CO2 24 02/18/2015 1309   GLUCOSE 84 02/18/2015 1309   BUN 7 02/18/2015 1309   CREATININE 0.48* 02/18/2015 1309   CREATININE 0.74 11/03/2014 1117   CALCIUM 8.9 02/18/2015 1309   PROT 6.4 02/18/2015 1309   ALBUMIN 3.8 02/18/2015 1309   AST 13 02/18/2015 1309   ALT 10 02/18/2015 1309   ALKPHOS 37* 02/18/2015 1309   BILITOT 0.5 02/18/2015 1309   GFRNONAA >90 02/18/2015 1309   GFRAA >90 02/18/2015 1309     Assessment and Plan  IUP @ 14+5 Dizziness RLQ Abdominal Pain  CBC CMP BG Pulse OX -80 Pt will be discharged to home per Dr Tama HighGrewell  Nicole [name] 02/18/2015, 1:07 PM

## 2015-02-18 NOTE — Discharge Instructions (Signed)
Dizziness  Dizziness means you feel unsteady or lightheaded. You might feel like you are going to pass out (faint). HOME CARE   Drink enough fluids to keep your pee (urine) clear or pale yellow.  Take your medicines exactly as told by your doctor. If you take blood pressure medicine, always stand up slowly from the lying or sitting position. Hold on to something to steady yourself.  If you need to stand in one place for a long time, move your legs often. Tighten and relax your leg muscles.  Have someone stay with you until you feel okay.  Do not drive or use heavy machinery if you feel dizzy.  Do not drink alcohol. GET HELP RIGHT AWAY IF:   You feel dizzy or lightheaded and it gets worse.  You feel sick to your stomach (nauseous), or you throw up (vomit).  You have trouble talking or walking.  You feel weak or have trouble using your arms, hands, or legs.  You cannot think clearly or have trouble forming sentences.  You have chest pain, belly (abdominal) pain, sweating, or you are short of breath.  Your vision changes.  You are bleeding.  You have problems from your medicine that seem to be getting worse. MAKE SURE YOU:   Understand these instructions.  Will watch your condition.  Will get help right away if you are not doing well or get worse. Document Released: 11/23/2011 Document Revised: 02/26/2012 Document Reviewed: 11/23/2011 [facility] Patient Information 2015 [place], [place]. This information is not intended to replace advice given to you by your health care provider. Make sure you discuss any questions you have with your health care provider. High Protein Diet A high protein diet means that high protein foods are added to your diet. Getting more protein in the diet is important for a number of reasons. Protein helps the body to build tissue, muscle, and to repair damage. People who have had surgery, injuries such as broken bones, infections, and burns, or illnesses  such as cancer, may need more protein in their diet.  SERVING SIZES Measuring foods and serving sizes helps to make sure you are getting the right amount of food. The list below tells how big or small some common serving sizes are.   1 oz.........4 stacked dice.  3 oz........Marland KitchenDeck of cards.  1 tsp.......Marland KitchenTip of little finger.  1 tbs......Marland KitchenMarland KitchenThumb.  2 tbs.......Marland KitchenGolf ball.   cup......Marland KitchenHalf of a fist.  1 cup.......Marland KitchenA fist. FOOD SOURCES OF PROTEIN Listed below are some food sources of protein and the amount of protein they contain. Your Registered Dietitian can calculate how many grams of protein you need for your medical condition. High protein foods can be added to the diet at mealtime or as snacks. Be sure to have at least 1 protein-containing food at each meal and snack to ensure adequate intake.  Meats and Meat Substitutes / Protein (g)  3 oz poultry (chicken, [place]) / 26 g  3 oz tuna, canned in water / 26 g  3 oz fish (cod) / 21 g  3 oz red meat (beef, pork) / 21 g  4 oz tofu / 9 g  1 egg / 6 g   cup egg substitute / 5 g  1 cup dried beans / 15 g  1 cup soy milk / 4 g Dairy / Protein (g)  1 cup milk (skim, 1%, 2%, whole) / 8 g   cup evaporated milk / 9 g  1 cup buttermilk / 8 g  1 cup low-fat plain yogurt / 11 g  1 cup regular plain yogurt / 9 g   cup cottage cheese / 14 g  1 oz cheddar cheese / 7 g Nuts / Protein (g)  2 tbs peanut butter / 8 g  1 oz peanuts / 7 g  2 tbs cashews / 5 g  2 tbs almonds / 5 g Document Released: 12/04/2005 Document Revised: 02/26/2012 Document Reviewed: 09/06/2007 [employer] Patient Information 2015 [place]. This information is not intended to replace advice given to you by your health care provider. Make sure you discuss any questions you have with your health care provider.

## 2015-02-18 NOTE — MAU Note (Signed)
Assisted to bathroom, states is feeling better, not as dizzy now.

## 2015-03-12 ENCOUNTER — Ambulatory Visit (HOSPITAL_COMMUNITY)
Admission: RE | Admit: 2015-03-12 | Discharge: 2015-03-12 | Disposition: A | Discharge status: Home / Self Care | Payer: BLUE CROSS/BLUE SHIELD | Source: Ambulatory Visit | Attending: Obstetrics and Gynecology | Admitting: Obstetrics and Gynecology

## 2015-03-12 ENCOUNTER — Other Ambulatory Visit (HOSPITAL_COMMUNITY): Payer: Self-pay | Admitting: Obstetrics and Gynecology

## 2015-03-12 ENCOUNTER — Encounter (HOSPITAL_COMMUNITY): Payer: Self-pay

## 2015-03-12 DIAGNOSIS — O09292 Supervision of pregnancy with other poor reproductive or obstetric history, second trimester: Secondary | ICD-10-CM | POA: Insufficient documentation

## 2015-03-12 DIAGNOSIS — Z36 Encounter for antenatal screening of mother: Secondary | ICD-10-CM | POA: Diagnosis not present

## 2015-03-12 DIAGNOSIS — Z3A17 17 weeks gestation of pregnancy: Secondary | ICD-10-CM

## 2015-03-12 DIAGNOSIS — IMO0002 Reserved for concepts with insufficient information to code with codable children: Secondary | ICD-10-CM

## 2015-03-12 DIAGNOSIS — Z3689 Encounter for other specified antenatal screening: Secondary | ICD-10-CM | POA: Insufficient documentation

## 2015-03-12 DIAGNOSIS — Z0489 Encounter for examination and observation for other specified reasons: Secondary | ICD-10-CM

## 2015-03-12 DIAGNOSIS — O352XX0 Maternal care for (suspected) hereditary disease in fetus, not applicable or unspecified: Secondary | ICD-10-CM | POA: Insufficient documentation

## 2015-03-12 DIAGNOSIS — O09299 Supervision of pregnancy with other poor reproductive or obstetric history, unspecified trimester: Secondary | ICD-10-CM | POA: Insufficient documentation

## 2015-03-12 NOTE — Progress Notes (Signed)
Genetic Counseling  High-Risk Gestation Note  Appointment Date:  03/12/2015 Referred By: Nicole Farrier, MD Date of Birth:  12-05-1991   Pregnancy History: G2P1001 Estimated Date of Delivery: 08/14/15 Estimated Gestational Age: 88w6dAttending: MRenella Cunas MD   I met with 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  We began by reviewing the family history in detail. Ms. THalfhillreported that her 89year-old son, Nicole Nicole [name] was born with Pierre-Robin sequence, which was detected by prenatal ultrasound. [name] seen in our office during that pregnancy in 2011. 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 Ms. Nicole [name] and he was followed through Nicole Nicole [name] He had surgical correction of his cleft palate. She Nicole [name] that his mandible grew on its own over time and did not require surgical correction. He reportedly does not have hearing or vision problems and is otherwise healthy. Ms. Nicole Nicole [name] that no specific underlying cause was determined for his Pierre-Robin sequence. No additional relatives were Nicole [name] with features of Pierre-Robin sequence. The current pregnancy is with a different partner from Nicole Nicole [name].   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 A sequence is a chain of events, which occurred as a result of a single structural problem. In Pierre-Robin sequence, an under developed mandible leads to micrognathia, which leads to glossoptosis, causing cleft palate. This is associated with an increased risk for obstructive apnea and feeding difficulties in the neonatal period. Pierre-Robin sequence is estimated to occur in 1 in 8,500-14,000 individuals. The majority of cases of Pierre-Robin sequence are isolated, but it can also be seen as a feature of an underlying syndrome. It is estimated that approximately a third of cases are syndromic, and approximately a third of those  cases are due to Stickler syndrome. Additional associated syndromes include 22q11.2 deletion syndrome, Treacher-Collins, Nager, and hemifacial microsomia. For cases that are isolated, recurrence risk has been Nicole [name] to be low for siblings. Rare familial cases of Pierre-Robin sequence have been Nicole [name] that appear to be consistent with autosomal recessive inheritance. We reviewed genes, chromosomes, and patterns of inheritance. In the case of an underlying syndrome, recurrence risk would depend upon that particular syndrome. We discussed that given the Nicole [name] family history of apparently nonsyndromic Pierre-Robin sequence for a half-sibling to the current pregnancy, recurrence risk for the current pregnancy would likely be low.   We discussed availability of detailed ultrasound to assess for possible features of Pierre-Robin sequence. Detailed ultrasound was performed today. Visualized fetal anatomy appeared normal. Complete ultrasound results Nicole [name] separately. The patient understands that ultrasound cannot diagnose or rule out all birth defects or genetic conditions prenatally. In the absence of an identified underlying genetic or chromosomal cause for Pierre-Robin sequence in the family, additional screening or testing in pregnancy would not be expected to be informative regarding the condition.   The family histories were otherwise found to be contributory for a female maternal first cousin to the Nicole [name] of the pregnancy with intellectual disability. This individual is in his 351'sand lives in a group home. He reportedly resembles relatives and was not described to have physical health problems or dysmorphic features. [name] counseled that there are many different causes of intellectual disabilities including environmental, multifactorial, and genetic etiologies.  We discussed that a specific diagnosis for intellectual disability can be determined in approximately 50% of these individuals.  In  the remaining 50% of individuals, a diagnosis may never be determined.  Regarding genetic causes,  we discussed that chromosome aberrations (aneuploidy, deletions, duplications, insertions, and translocations) are responsible for a small percentage of individuals with intellectual disability.  Many individuals with chromosome aberrations have additional differences, including congenital anomalies or minor dysmorphisms.  Likewise, single gene conditions are the underlying cause of intellectual delay in some families.  We discussed that many gene conditions have intellectual disability as a feature, but also often include other physical or medical differences.  A medical genetics evaluation would be available to this individual to assess for possible underlying etiology, if not previously performed, in order to better assess recurrence risk for relatives. We discussed that without more specific information, it is difficult to provide an accurate risk assessment.  Further genetic counseling is warranted if more information is obtained.   Additionally, Ms. Nicole Nicole [name] a paternal half-brother with autism. The underlying etiology is not known. We discussed that autism is part of the spectrum of conditions referred to as Autistic spectrum disorders (ASD). We discussed that ASDs are among the most common neurodevelopmental disorders, with approximately 1 in 88 children meeting criteria for ASD. Approximately 80% of individuals diagnosed are female. There is strong evidence that genetic factors play a critical role in development of ASD. There have been recent advances in identifying specific genetic causes of ASD, however, there are still many individuals for whom the etiology of the ASD is not known. Once a family has a child with a diagnosis of ASD, there is a 13.5% chance to have another child with ASD. If the pregnancy is female the chance is approximately 9%, and approximately 26% if the pregnancy is female. We  discussed that data are limited regarding recurrence risk estimated for extended degree relatives. However, in the case of multifactorial inheritance, recurrence risk for the current pregnancy (a third degree relative to the affected individual), given the Nicole [name] family history, would likely be close to the general population risk. They understand that at this time there is not genetic testing available for ASD in pregnancy for most families. Without further information regarding the provided family history, an accurate genetic risk cannot be calculated. Further genetic counseling is warranted if more information is obtained.  Ms. Nicole Nicole [name] was provided with written information regarding cystic fibrosis (CF) including the carrier frequency and incidence in the Caucasian population, the availability of carrier testing and prenatal diagnosis if indicated.  In addition, we discussed that CF is routinely screened for as part of the [place] newborn screening panel.  She declined CF testing today.   Ms. Nicole Nicole [name] denied exposure to environmental toxins or chemical agents. She denied the use of alcohol, tobacco or street drugs. She denied significant viral illnesses during the course of her pregnancy. Her medical and surgical histories were noncontributory.   I counseled Ms. Nicole Nicole [name] regarding the above risks and available options.  The approximate face-to-face time with the genetic counselor was 35 minutes.  Chipper Oman, MS Certified Genetic Counselor 03/12/2015

## 2015-04-01 ENCOUNTER — Ambulatory Visit: Payer: BLUE CROSS/BLUE SHIELD | Admitting: Cardiology

## 2015-04-16 ENCOUNTER — Encounter: Payer: Self-pay | Admitting: Cardiology

## 2015-05-01 ENCOUNTER — Observation Stay (HOSPITAL_COMMUNITY)
Admission: EM | Admit: 2015-05-01 | Discharge: 2015-05-02 | DRG: 781 | Disposition: A | Discharge status: Home / Self Care | Payer: BLUE CROSS/BLUE SHIELD | Attending: Obstetrics and Gynecology | Admitting: Obstetrics and Gynecology

## 2015-05-01 DIAGNOSIS — O26899 Other specified pregnancy related conditions, unspecified trimester: Secondary | ICD-10-CM

## 2015-05-01 DIAGNOSIS — R1084 Generalized abdominal pain: Secondary | ICD-10-CM | POA: Diagnosis present

## 2015-05-01 DIAGNOSIS — R109 Unspecified abdominal pain: Secondary | ICD-10-CM

## 2015-05-01 DIAGNOSIS — O99612 Diseases of the digestive system complicating pregnancy, second trimester: Secondary | ICD-10-CM | POA: Diagnosis not present

## 2015-05-01 DIAGNOSIS — Z3A27 27 weeks gestation of pregnancy: Secondary | ICD-10-CM | POA: Diagnosis present

## 2015-05-01 DIAGNOSIS — Z87891 Personal history of nicotine dependence: Secondary | ICD-10-CM | POA: Diagnosis not present

## 2015-05-01 DIAGNOSIS — R197 Diarrhea, unspecified: Secondary | ICD-10-CM | POA: Diagnosis present

## 2015-05-01 DIAGNOSIS — Z3490 Encounter for supervision of normal pregnancy, unspecified, unspecified trimester: Secondary | ICD-10-CM

## 2015-05-01 DIAGNOSIS — Z3A25 25 weeks gestation of pregnancy: Secondary | ICD-10-CM | POA: Insufficient documentation

## 2015-05-01 NOTE — ED Provider Notes (Signed)
CSN: [id number]     Arrival date & time 05/01/15  2348 History  This chart was scribed for Nicole [name] Maico Mulvehill, MD by Modena JanskyAlbert Thayil, ED Scribe. This patient was seen in room APA01/APA01 and the patient's care was started at 11:57 PM.   Chief Complaint  Patient presents with  . Abdominal Pain   The history is provided by the patient. No language interpreter was used.   HPI Comments: Nicole [name] is a 24 y.o. female who is G2P1AB0 and LMP 11/01/2014 presents to the Emergency Department complaining of constant moderate diffuse abdominal pain that started last night. She states that she currently [redacted] weeks pregnant with her second baby without prior complications. She reports that she has been having abdominal pain radiating into her back all last night and today with mild diarrhea last night but not tonight. She states she called her physician tonight and that her physician told her to come to the ED by EMS now. She denies any known trauma. She describes the pain as a sharp sensation and currently rates the severity of the pain as a 9/10. She states that there are no modifying factors for the pain, nothing makes it hurt more, nothing makes it feel better, including position. She reports that she quit smoking 2 years ago. She denies any prior hx of symptoms during pregnancy. She also denies any nausea, vomiting, dysuria, urinary frequency, vaginal bleeding or hematuria. She states she is feeling the baby movements like usual. She states her blood type is O+.  Pediatrician- [facility] [place]  Past Medical History  Diagnosis Date  . Hypoglycemia   . Scoliosis   . Syncope   . Infection     UTI  . Depression     hx age 24, fine now   Past Surgical History  Procedure Laterality Date  . Mouth surgery     Family History  Problem Relation Age of Onset  . Cancer Mother 752    breast  . Cancer Maternal Grandmother     lung  . Diabetes Paternal Grandfather   . Otilio Jeffersonierre Robin sequence Son      diagnosed prenatally, delivered and [facility]   History  Substance Use Topics  . Smoking status: Former Smoker    Types: Cigarettes    Quit date: 06/07/2013  . Smokeless tobacco: Never Used  . Alcohol Use: No     Comment: occ, prior to preg  unemployed   OB History    Gravida Para Term Preterm AB TAB SAB Ectopic Multiple Living   2 1 1       1      Review of Systems  Gastrointestinal: Positive for abdominal pain and diarrhea. Negative for nausea and vomiting.  Genitourinary: Negative for dysuria, frequency and hematuria.  All other systems reviewed and are negative.   Allergies  Review of patient's allergies indicates no known allergies.  Home Medications   Prior to Admission medications   Medication Sig Start Date End Date Taking? Authorizing Provider  Prenatal Vit-Fe Fumarate-FA (PRENATAL MULTIVITAMIN) TABS tablet Take 1 tablet by mouth daily at 12 noon.    Historical Provider, MD  promethazine (PHENERGAN) 25 MG tablet Take 0.5 tablets (12.5 mg total) by mouth every 6 (six) hours as needed for nausea or vomiting. 12/28/14   Duane LopeJennifer I Rasch, NP   BP 132/73 mmHg  Pulse 100  Temp(Src) 98.1 F (36.7 C)  Resp 24  Ht 5\' 9"  (1.753 m)  Wt 143 lb (64.864  kg)  BMI 21.11 kg/m2  SpO2 100%  LMP 11/01/2014 (Approximate)  Vital signs normal except for tachycardia and elevated blood pressure  Physical Exam  Constitutional: She is oriented to person, place, and time. She appears well-developed and well-nourished.  Non-toxic appearance. She does not appear ill. She appears distressed.  Tearful and seems upset  HENT:  Head: Normocephalic and atraumatic.  Right Ear: External ear normal.  Left Ear: External ear normal.  Nose: Nose normal. No mucosal edema or rhinorrhea.  Mouth/Throat: Oropharynx is clear and moist and mucous membranes are normal. No dental abscesses or uvula swelling.  Eyes: Conjunctivae and EOM are normal. Pupils are equal, round, and reactive  to light.  Neck: Normal range of motion and full passive range of motion without pain. Neck supple.  Cardiovascular: Normal rate, regular rhythm and normal heart sounds.  Exam reveals no gallop and no friction rub.   No murmur heard. Pulmonary/Chest: Effort normal and breath sounds normal. No respiratory distress. She has no wheezes. She has no rhonchi. She has no rales. She exhibits no tenderness and no crepitus.  Abdominal: Soft. Normal appearance and bowel sounds are normal. She exhibits no distension. There is tenderness. There is no rebound and no guarding.    FHR was 134. Although pt complains of diffuse abdominal pain, she reacts to palpation of the right side of abdomen. Uterus consistent with dates.  Musculoskeletal: Normal range of motion. She exhibits no edema or tenderness.  Moves all extremities well.   Neurological: She is alert and oriented to person, place, and time. She has normal strength. No cranial nerve deficit.  Skin: Skin is warm, dry and intact. No rash noted. No erythema. No pallor.  Psychiatric: She has a normal mood and affect. Her speech is normal and behavior is normal. Her mood appears not anxious.  Nursing note and vitals reviewed.   ED Course  Procedures (including critical care time)  Medications  0.9 %  sodium chloride infusion ( Intravenous New Bag/Given 05/02/15 0023)  morphine 4 MG/ML injection 4 mg (4 mg Intravenous Given 05/02/15 0023)    DIAGNOSTIC STUDIES: Oxygen Saturation is 100% on RA, normal by my interpretation.    COORDINATION OF CARE: 12:01 AM- Pt advised of plan for treatment which includes medication and labs and pt agrees.  Patient was given IV fluids and pain medication.  Review of prior charts revealed that pt had abdominal pain in February and March, at which time, March, she was admitted overnight in the MAU. No definitive diagnosis was given.   01:10 [facility] has called. Fetal monitoring shows few contractions otherwise  normal.    Repeat blood pressure was done and was 124/77 of heart rate is 72  01:55 Dr Renaldo Fiddler, discussed patient who states she is still hurting "real bad", accepts for admission to [facility].   Labs Review Results for orders placed or performed during the hospital encounter of 05/01/15  Comprehensive metabolic panel  Result Value Ref Range   Sodium 137 135 - 145 mmol/L   Potassium 3.9 3.5 - 5.1 mmol/L   Chloride 106 101 - 111 mmol/L   CO2 24 22 - 32 mmol/L   Glucose, Bld 92 65 - 99 mg/dL   BUN 11 6 - 20 mg/dL   Creatinine, Ser 0.45 0.44 - 1.00 mg/dL   Calcium 8.7 (L) 8.9 - 10.3 mg/dL   Total Protein 6.8 6.5 - 8.1 g/dL   Albumin 3.8 3.5 - 5.0 g/dL   AST 13 (L)  15 - 41 U/L   ALT 8 (L) 14 - 54 U/L   Alkaline Phosphatase 56 38 - 126 U/L   Total Bilirubin 0.3 0.3 - 1.2 mg/dL   GFR calc non Af Amer >60 >60 mL/min   GFR calc Af Amer >60 >60 mL/min   Anion gap 7 5 - 15  Lipase, blood  Result Value Ref Range   Lipase 26 22 - 51 U/L  CBC with Differential  Result Value Ref Range   WBC 9.7 4.0 - 10.5 K/uL   RBC 3.38 (L) 3.87 - 5.11 MIL/uL   Hemoglobin 10.6 (L) 12.0 - 15.0 g/dL   HCT 16.131.2 (L) 09.636.0 - 04.546.0 %   MCV 92.3 78.0 - 100.0 fL   MCH 31.4 26.0 - 34.0 pg   MCHC 34.0 30.0 - 36.0 g/dL   RDW 40.913.8 81.111.5 - 91.415.5 %   Platelets 268 150 - 400 K/uL   Neutrophils Relative % 67 43 - 77 %   Neutro Abs 6.6 1.7 - 7.7 K/uL   Lymphocytes Relative 25 12 - 46 %   Lymphs Abs 2.4 0.7 - 4.0 K/uL   Monocytes Relative 6 3 - 12 %   Monocytes Absolute 0.6 0.1 - 1.0 K/uL   Eosinophils Relative 2 0 - 5 %   Eosinophils Absolute 0.2 0.0 - 0.7 K/uL   Basophils Relative 0 0 - 1 %   Basophils Absolute 0.0 0.0 - 0.1 K/uL  Urinalysis, Routine w reflex microscopic  Result Value Ref Range   Color, Urine YELLOW YELLOW   APPearance CLEAR CLEAR   Specific Gravity, Urine 1.020 1.005 - 1.030   pH 6.0 5.0 - 8.0   Glucose, UA NEGATIVE NEGATIVE mg/dL   Hgb urine dipstick NEGATIVE NEGATIVE   Bilirubin  Urine NEGATIVE NEGATIVE   Ketones, ur NEGATIVE NEGATIVE mg/dL   Protein, ur NEGATIVE NEGATIVE mg/dL   Urobilinogen, UA 0.2 0.0 - 1.0 mg/dL   Nitrite NEGATIVE NEGATIVE   Leukocytes, UA NEGATIVE NEGATIVE  Urine rapid drug screen (hosp performed)  Result Value Ref Range   Opiates POSITIVE (A) NONE DETECTED   Cocaine NONE DETECTED NONE DETECTED   Benzodiazepines NONE DETECTED NONE DETECTED   Amphetamines NONE DETECTED NONE DETECTED   Tetrahydrocannabinol NONE DETECTED NONE DETECTED   Barbiturates NONE DETECTED NONE DETECTED   Laboratory interpretation all normal except anemia     Imaging Review No results found.   EKG Interpretation None      MDM   Final diagnoses:  Pregnancy  Generalized abdominal pain   [facility]  Nicole [name] Brentt Fread, MD, FACEP   I personally performed the services described in this documentation, which was scribed in my presence. The recorded information has been reviewed and considered.  Nicole [name] Ikran Patman, MD, Concha PyoFACEP     Karry Causer, MD 05/02/15 [phone]

## 2015-05-02 ENCOUNTER — Observation Stay (HOSPITAL_COMMUNITY): Payer: BLUE CROSS/BLUE SHIELD

## 2015-05-02 ENCOUNTER — Encounter (HOSPITAL_COMMUNITY): Payer: Self-pay | Admitting: Emergency Medicine

## 2015-05-02 DIAGNOSIS — O26899 Other specified pregnancy related conditions, unspecified trimester: Secondary | ICD-10-CM | POA: Insufficient documentation

## 2015-05-02 DIAGNOSIS — R1084 Generalized abdominal pain: Secondary | ICD-10-CM | POA: Diagnosis present

## 2015-05-02 DIAGNOSIS — R109 Unspecified abdominal pain: Secondary | ICD-10-CM

## 2015-05-02 DIAGNOSIS — Z3A25 25 weeks gestation of pregnancy: Secondary | ICD-10-CM | POA: Insufficient documentation

## 2015-05-02 LAB — COMPREHENSIVE METABOLIC PANEL
ALBUMIN: 3.3 g/dL — AB (ref 3.5–5.0)
ALT: 7 U/L — AB (ref 14–54)
ALT: 8 U/L — AB (ref 14–54)
ANION GAP: 7 (ref 5–15)
ANION GAP: 7 (ref 5–15)
AST: 12 U/L — AB (ref 15–41)
AST: 13 U/L — ABNORMAL LOW (ref 15–41)
Albumin: 3.8 g/dL (ref 3.5–5.0)
Alkaline Phosphatase: 50 U/L (ref 38–126)
Alkaline Phosphatase: 56 U/L (ref 38–126)
BILIRUBIN TOTAL: 0.1 mg/dL — AB (ref 0.3–1.2)
BUN: 11 mg/dL (ref 6–20)
BUN: 8 mg/dL (ref 6–20)
CALCIUM: 8.5 mg/dL — AB (ref 8.9–10.3)
CO2: 24 mmol/L (ref 22–32)
CO2: 25 mmol/L (ref 22–32)
CREATININE: 0.56 mg/dL (ref 0.44–1.00)
Calcium: 8.7 mg/dL — ABNORMAL LOW (ref 8.9–10.3)
Chloride: 104 mmol/L (ref 101–111)
Chloride: 106 mmol/L (ref 101–111)
Creatinine, Ser: 0.48 mg/dL (ref 0.44–1.00)
GFR calc Af Amer: >60 mL/min (ref >60)
GFR calc Af Amer: >60 mL/min (ref >60)
GLUCOSE: 92 mg/dL (ref 65–99)
Glucose, Bld: 92 mg/dL (ref 65–99)
POTASSIUM: 3.4 mmol/L — AB (ref 3.5–5.1)
Potassium: 3.9 mmol/L (ref 3.5–5.1)
Sodium: 136 mmol/L (ref 135–145)
Sodium: 137 mmol/L (ref 135–145)
Total Bilirubin: 0.3 mg/dL (ref 0.3–1.2)
Total Protein: 5.8 g/dL — ABNORMAL LOW (ref 6.5–8.1)
Total Protein: 6.8 g/dL (ref 6.5–8.1)

## 2015-05-02 LAB — CBC
HCT: 28.5 % — ABNORMAL LOW (ref 36.0–46.0)
Hemoglobin: 9.6 g/dL — ABNORMAL LOW (ref 12.0–15.0)
MCH: 31 pg (ref 26.0–34.0)
MCHC: 33.7 g/dL (ref 30.0–36.0)
MCV: 91.9 fL (ref 78.0–100.0)
PLATELETS: 232 10*3/uL (ref 150–400)
RBC: 3.1 MIL/uL — AB (ref 3.87–5.11)
RDW: 14.1 % (ref 11.5–15.5)
WBC: 8.4 10*3/uL (ref 4.0–10.5)

## 2015-05-02 LAB — CBC WITH DIFFERENTIAL/PLATELET
BASOS PCT: 0 % (ref 0–1)
Basophils Absolute: 0 10*3/uL (ref 0.0–0.1)
Eosinophils Absolute: 0.2 10*3/uL (ref 0.0–0.7)
Eosinophils Relative: 2 % (ref 0–5)
HCT: 31.2 % — ABNORMAL LOW (ref 36.0–46.0)
Hemoglobin: 10.6 g/dL — ABNORMAL LOW (ref 12.0–15.0)
LYMPHS PCT: 25 % (ref 12–46)
Lymphs Abs: 2.4 10*3/uL (ref 0.7–4.0)
MCH: 31.4 pg (ref 26.0–34.0)
MCHC: 34 g/dL (ref 30.0–36.0)
MCV: 92.3 fL (ref 78.0–100.0)
MONO ABS: 0.6 10*3/uL (ref 0.1–1.0)
Monocytes Relative: 6 % (ref 3–12)
Neutro Abs: 6.6 10*3/uL (ref 1.7–7.7)
Neutrophils Relative %: 67 % (ref 43–77)
PLATELETS: 268 10*3/uL (ref 150–400)
RBC: 3.38 MIL/uL — AB (ref 3.87–5.11)
RDW: 13.8 % (ref 11.5–15.5)
WBC: 9.7 10*3/uL (ref 4.0–10.5)

## 2015-05-02 LAB — URINALYSIS, ROUTINE W REFLEX MICROSCOPIC
BILIRUBIN URINE: NEGATIVE
Glucose, UA: NEGATIVE mg/dL
HGB URINE DIPSTICK: NEGATIVE
Ketones, ur: NEGATIVE mg/dL
Leukocytes, UA: NEGATIVE
NITRITE: NEGATIVE
PH: 6 (ref 5.0–8.0)
Protein, ur: NEGATIVE mg/dL
Specific Gravity, Urine: 1.02 (ref 1.005–1.030)
Urobilinogen, UA: 0.2 mg/dL (ref 0.0–1.0)

## 2015-05-02 LAB — RAPID URINE DRUG SCREEN, HOSP PERFORMED
Amphetamines: NOT DETECTED
Barbiturates: NOT DETECTED
Benzodiazepines: NOT DETECTED
Cocaine: NOT DETECTED
Opiates: POSITIVE — AB
Tetrahydrocannabinol: NOT DETECTED

## 2015-05-02 LAB — LIPASE, BLOOD: Lipase: 26 U/L (ref 22–51)

## 2015-05-02 MED ORDER — SODIUM CHLORIDE 0.9 % IV SOLN
INTRAVENOUS | Status: DC
  Administered 2015-05-02: via INTRAVENOUS

## 2015-05-02 MED ORDER — PANTOPRAZOLE SODIUM 20 MG PO TBEC: 20.0000 mg | DELAYED_RELEASE_TABLET | Freq: Every day | ORAL | Status: DC

## 2015-05-02 MED ORDER — OXYCODONE-ACETAMINOPHEN 5-325 MG PO TABS
1.0000 | ORAL_TABLET | Freq: Four times a day (QID) | ORAL | Status: DC | PRN
  Administered 2015-05-02 (×2): 2 via ORAL
  Filled 2015-05-02 (×2): qty 2

## 2015-05-02 MED ORDER — MORPHINE SULFATE 4 MG/ML IJ SOLN
4.0000 mg | Freq: Once | INTRAMUSCULAR | Status: AC
  Administered 2015-05-02: 4 mg via INTRAVENOUS
  Filled 2015-05-02: qty 1

## 2015-05-02 MED ORDER — PANTOPRAZOLE SODIUM 20 MG PO TBEC
20.0000 mg | DELAYED_RELEASE_TABLET | Freq: Every day | ORAL | Status: DC
  Administered 2015-05-02: 20 mg via ORAL
  Filled 2015-05-02: qty 1

## 2015-05-02 MED ORDER — SODIUM CHLORIDE 0.9 % IV SOLN: INTRAVENOUS | Status: DC

## 2015-05-02 MED ORDER — OXYCODONE-ACETAMINOPHEN 5-325 MG PO TABS: 1.0000 | ORAL_TABLET | Freq: Four times a day (QID) | ORAL | Status: DC | PRN

## 2015-05-02 MED ORDER — SODIUM CHLORIDE 0.9 % IJ SOLN: 3.0000 mL | Freq: Two times a day (BID) | INTRAMUSCULAR | Status: DC

## 2015-05-02 NOTE — ED Notes (Signed)
Patient to be [facility] to [facility].

## 2015-05-02 NOTE — Discharge Instructions (Signed)
Abdominal Pain During Pregnancy Abdominal pain is common in pregnancy. Most of the time, it does not cause harm. There are many causes of abdominal pain. Some causes are more serious than others. Some of the causes of abdominal pain in pregnancy are easily diagnosed. Occasionally, the diagnosis takes time to understand. Other times, the cause is not determined. Abdominal pain can be a sign that something is very wrong with the pregnancy, or the pain may have nothing to do with the pregnancy at all. For this reason, always tell your health care provider if you have any abdominal discomfort. HOME CARE INSTRUCTIONS  Monitor your abdominal pain for any changes. The following actions may help to alleviate any discomfort you are experiencing:  Do not have sexual intercourse or put anything in your vagina until your symptoms go away completely.  Get plenty of rest until your pain improves.  Drink clear fluids if you feel nauseous. Avoid solid food as long as you are uncomfortable or nauseous.  Only take over-the-counter or prescription medicine as directed by your health care provider.  Keep all follow-up appointments with your health care provider. SEEK IMMEDIATE MEDICAL CARE IF:  You are bleeding, leaking fluid, or passing tissue from the vagina.  You have increasing pain or cramping.  You have persistent vomiting.  You have painful or bloody urination.  You have a fever.  You notice a decrease in your baby's movements.  You have extreme weakness or feel faint.  You have shortness of breath, with or without abdominal pain.  You develop a severe headache with abdominal pain.  You have abnormal vaginal discharge with abdominal pain.  You have persistent diarrhea.  You have abdominal pain that continues even after rest, or gets worse. MAKE SURE YOU:   Understand these instructions.  Will watch your condition.  Will get help right away if you are not doing well or get  worse. Document Released: 12/04/2005 Document Revised: 09/24/2013 Document Reviewed: 07/03/2013 [facility] Patient Information 2015 [place], [place]. This information is not intended to replace advice given to you by your health care provider. Make sure you discuss any questions you have with your health care provider.  Back Pain in Pregnancy Back pain during pregnancy is common. It happens in about half of all pregnancies. It is important for you and your baby that you remain active during your pregnancy.If you feel that back pain is not allowing you to remain active or sleep well, it is time to see your caregiver. Back pain may be caused by several factors related to changes during your pregnancy.Fortunately, unless you had trouble with your back before your pregnancy, the pain is likely to get better after you deliver. Low back pain usually occurs between the fifth and seventh months of pregnancy. It can, however, happen in the first couple months. Factors that increase the risk of back problems include:   Previous back problems.  Injury to your back.  Having twins or multiple births.  A chronic cough.  Stress.  Job-related repetitive motions.  Muscle or spinal disease in the back.  Family history of back problems, ruptured (herniated) discs, or osteoporosis.  Depression, anxiety, and panic attacks. CAUSES   When you are pregnant, your body produces a hormone called relaxin. This hormonemakes the ligaments connecting the low back and pubic bones more flexible. This flexibility allows the baby to be delivered more easily. When your ligaments are loose, your muscles need to work harder to support your back. Soreness in your back  can come from tired muscles. Soreness can also come from back tissues that are irritated since they are receiving less support.  As the baby grows, it puts pressure on the nerves and blood vessels in your pelvis. This can cause back pain.  As the baby grows  and gets heavier during pregnancy, the uterus pushes the stomach muscles forward and changes your center of gravity. This makes your back muscles work harder to maintain good posture. SYMPTOMS  Lumbar pain during pregnancy Lumbar pain during pregnancy usually occurs at or above the waist in the center of the back. There may be pain and numbness that radiates into your leg or foot. This is similar to low back pain experienced by non-pregnant women. It usually increases with sitting for long periods of time, standing, or repetitive lifting. Tenderness may also be present in the muscles along your upper back. Posterior pelvic pain during pregnancy Pain in the back of the pelvis is more common than lumbar pain in pregnancy. It is a deep pain felt in your side at the waistline, or across the tailbone (sacrum), or in both places. You may have pain on one or both sides. This pain can also go into the buttocks and backs of the upper thighs. Pubic and groin pain may also be present. The pain does not quickly resolve with rest, and morning stiffness may also be present. Pelvic pain during pregnancy can be brought on by most activities. A high level of fitness before and during pregnancy may or may not prevent this problem. Labor pain is usually 1 to 2 minutes apart, lasts for about 1 minute, and involves a bearing down feeling or pressure in your pelvis. However, if you are at term with the pregnancy, constant low back pain can be the beginning of early labor, and you should be aware of this. DIAGNOSIS  X-rays of the back should not be done during the first 12 to 14 weeks of the pregnancy and only when absolutely necessary during the rest of the pregnancy. MRIs do not give off radiation and are safe during pregnancy. MRIs also should only be done when absolutely necessary. HOME CARE INSTRUCTIONS  Exercise as directed by your caregiver. Exercise is the most effective way to prevent or manage back pain. If you have a  back problem, it is especially important to avoid sports that require sudden body movements. Swimming and walking are great activities.  Do not stand in one place for long periods of time.  Do not wear high heels.  Sit in chairs with good posture. Use a pillow on your lower back if necessary. Make sure your head rests over your shoulders and is not hanging forward.  Try sleeping on your side, preferably the left side, with a pillow or two between your legs. If you are sore after a night's rest, your bedmay betoo soft.Try placing a board between your mattress and box spring.  Listen to your body when lifting.If you are experiencing pain, ask for help or try bending yourknees more so you can use your leg muscles rather than your back muscles. Squat down when picking up something from the floor. Do not bend over.  Eat a healthy diet. Try to gain weight within your caregiver's recommendations.  Use heat or cold packs 3 to 4 times a day for 15 minutes to help with the pain.  Only take over-the-counter or prescription medicines for pain, discomfort, or fever as directed by your caregiver. Sudden (acute) back pain  Use bed rest for only the most extreme, acute episodes of back pain. Prolonged bed rest over 48 hours will aggravate your condition.  Ice is very effective for acute conditions.  Put ice in a plastic bag.  Place a towel between your skin and the bag.  Leave the ice on for 10 to 20 minutes every 2 hours, or as needed.  Using heat packs for 30 minutes prior to activities is also helpful. Continued back pain See your caregiver if you have continued problems. Your caregiver can help or refer you for appropriate physical therapy. With conditioning, most back problems can be avoided. Sometimes, a more serious issue may be the cause of back pain. You should be seen right away if new problems seem to be developing. Your caregiver may recommend:  A maternity girdle.  An elastic  sling.  A back brace.  A massage therapist or acupuncture. SEEK MEDICAL CARE IF:   You are not able to do most of your daily activities, even when taking the pain medicine you were given.  You need a referral to a physical therapist or chiropractor.  You want to try acupuncture. SEEK IMMEDIATE MEDICAL CARE IF:  You develop numbness, tingling, weakness, or problems with the use of your arms or legs.  You develop severe back pain that is no longer relieved with medicines.  You have a sudden change in bowel or bladder control.  You have increasing pain in other areas of the body.  You develop shortness of breath, dizziness, or fainting.  You develop nausea, vomiting, or sweating.  You have back pain which is similar to labor pains.  You have back pain along with your water breaking or vaginal bleeding.  You have back pain or numbness that travels down your leg.  Your back pain developed after you fell.  You develop pain on one side of your back. You may have a kidney stone.  You see blood in your urine. You may have a bladder infection or kidney stone.  You have back pain with blisters. You may have shingles. Back pain is fairly common during pregnancy but should not be accepted as just part of the process. Back pain should always be treated as soon as possible. This will make your pregnancy as pleasant as possible. Document Released: 03/14/2006 Document Revised: 02/26/2012 Document Reviewed: 04/25/2011 [facility] Patient Information 2015 [place]. This information is not intended to replace advice given to you by your health care provider. Make sure you discuss any questions you have with your health care provider.  Only take Percocet when pain is severe.

## 2015-05-02 NOTE — Progress Notes (Signed)
TC to SidmanAdkins to inform that pt have arrived to unit general appearance with description of pain stabbing constant in rt lower abdomen and left flank.  Orders received.

## 2015-05-02 NOTE — ED Notes (Signed)
Patient requests not to have an I&O cath.  Patient placed on the bedpan.

## 2015-05-02 NOTE — Progress Notes (Signed)
Nicole [name] and I discussed the usage of percocet and that she should limit the use.  She understands to follow up with Dr Renaldo FiddlerAdkins in 1 week.  She is in no signs of distress and looks clinically stable.  Her significant other is at the bedside and she is ready for discharge.

## 2015-05-02 NOTE — ED Notes (Signed)
Patient placed on bedpan.  Family member at bedside.

## 2015-05-02 NOTE — H&P (Signed)
Chief Complaint  Patient presents with  . Abdominal Pain     HPI Comments: Nicole BachWhitney [name] is a [age] y.o. female who is G2P1AB0 and LMP 11/01/2014 presents to the Emergency Department complaining of constant moderate diffuse abdominal pain that started last night. She states that she currently [redacted] weeks pregnant with her second baby without prior complications. She reports that she has been having abdominal pain radiating into her back all last night and today with mild diarrhea last night but not tonight. She states she called her physician tonight and that her physician told her to come to the ED by EMS now. She denies any known trauma. She describes the pain as a sharp sensation and currently rates the severity of the pain as a 9/10. She states that there are no modifying factors for the pain, nothing makes it hurt more, nothing makes it feel better, including position. She reports that she quit smoking 2 years ago. She denies any prior hx of symptoms during pregnancy. She also denies any nausea, vomiting, dysuria, urinary frequency, vaginal bleeding or hematuria. She states she is feeling the baby movements like usual. She states her blood type is O+.  Pediatrician- [facility] [place]  Past Medical History  Diagnosis Date  . Hypoglycemia   . Scoliosis   . Syncope   . Infection     UTI  . Depression     hx age 24, fine now   Past Surgical History  Procedure Laterality Date  . Mouth surgery     Family History  Problem Relation Age of Onset  . Cancer Mother 2452    breast  . Cancer Maternal Grandmother     lung  . Diabetes Paternal Grandfather   . Otilio Jeffersonierre Robin sequence Son     diagnosed prenatally, delivered and [facility]   History  Substance Use Topics  . Smoking status: Former Smoker    Types: Cigarettes    Quit date: 06/07/2013  . Smokeless tobacco: Never Used    . Alcohol Use: No     Comment: occ, prior to preg  unemployed   OB History    Gravida Para Term Preterm AB TAB SAB Ectopic Multiple Living   2 1 1       1      Review of Systems  Gastrointestinal: Positive for abdominal pain and diarrhea. Negative for nausea and vomiting.  Genitourinary: Negative for dysuria, frequency and hematuria.  All other systems reviewed and are negative.   Allergies  Review of patient's allergies indicates no known allergies.  Home Medications   Prior to Admission medications   Medication Sig Start Date End Date Taking? Authorizing Provider  Prenatal Vit-Fe Fumarate-FA (PRENATAL MULTIVITAMIN) TABS tablet Take 1 tablet by mouth daily at 12 noon.    Historical Provider, MD  promethazine (PHENERGAN) 25 MG tablet Take 0.5 tablets (12.5 mg total) by mouth every 6 (six) hours as needed for nausea or vomiting. 12/28/14   Harolyn RutherfordJennifer I Rasch, NP   BP 132/73 mmHg  Pulse 100  Temp(Src) 98.1 F (36.7 C)  Resp 24  Ht 5\' 9"  (1.753 m)  Wt 143 lb (64.864 kg)  BMI 21.11 kg/m2  SpO2 100%  LMP 11/01/2014 (Approximate)  Vital signs normal except for tachycardia and elevated blood pressure  Physical Exam  Constitutional: She is oriented to person, place, and time. She appears well-developed and well-nourished. Non-toxic appearance. She does not appear ill. She appears distressed.  Tearful and seems upset  HENT:  Head: Normocephalic and atraumatic.  Right Ear: External ear normal.  Left Ear: External ear normal.  Nose: Nose normal. No mucosal edema or rhinorrhea.  Mouth/Throat: Oropharynx is clear and moist and mucous membranes are normal. No dental abscesses or uvula swelling.  Eyes: Conjunctivae and EOM are normal. Pupils are equal, round, and reactive to light.  Neck: Normal range of motion and full passive range of motion without pain. Neck supple.  Cardiovascular: Normal rate, regular rhythm and  normal heart sounds. Exam reveals no gallop and no friction rub.  No murmur heard. Pulmonary/Chest: Effort normal and breath sounds normal. No respiratory distress. She has no wheezes. She has no rhonchi. She has no rales. She exhibits no tenderness and no crepitus.  Abdominal: Soft. Normal appearance and bowel sounds are normal. She exhibits no distension. There is tenderness. There is no rebound and no guarding.    FHR was 134. Although pt complains of diffuse abdominal pain, she reacts to palpation of the right side of abdomen. Uterus consistent with dates.  Musculoskeletal: Normal range of motion. She exhibits no edema or tenderness.  Moves all extremities well.  Neurological: She is alert and oriented to person, place, and time. She has normal strength. No cranial nerve deficit.  Skin: Skin is warm, dry and intact. No rash noted. No erythema. No pallor.  Psychiatric: She has a normal mood and affect. Her speech is normal and behavior is normal. Her mood appears not anxious.  Nursing note and vitals reviewed.   ED Course  Procedures (including critical care time)  Medications  0.9 % sodium chloride infusion ( Intravenous New Bag/Given 05/02/15 0023)  morphine 4 MG/ML injection 4 mg (4 mg Intravenous Given 05/02/15 0023)    DIAGNOSTIC STUDIES: Oxygen Saturation is 100% on RA, normal by my interpretation.   COORDINATION OF CARE: 12:01 AM- Pt advised of plan for treatment which includes medication and labs and pt agrees.  Patient was given IV fluids and pain medication.  Review of prior charts revealed that pt had abdominal pain in February and March, at which time, March, she was admitted overnight in the MAU. No definitive diagnosis was given.   [facility] Fetal monitoring shows few contractions otherwise normal.   Repeat blood pressure was done and was 124/77 of heart rate is 72  01:55 Dr Renaldo FiddlerAdkins, discussed patient who states she is still  hurting "real bad", [facility].   Labs Review Results for orders placed or performed during the hospital encounter of 05/01/15  Comprehensive metabolic panel  Result Value Ref Range   Sodium 137 135 - 145 mmol/L   Potassium 3.9 3.5 - 5.1 mmol/L   Chloride 106 101 - 111 mmol/L   CO2 24 22 - 32 mmol/L   Glucose, Bld 92 65 - 99 mg/dL   BUN 11 6 - 20 mg/dL   Creatinine, Ser 8.110.48 0.44 - 1.00 mg/dL   Calcium 8.7 (L) 8.9 - 10.3 mg/dL   Total Protein 6.8 6.5 - 8.1 g/dL   Albumin 3.8 3.5 - 5.0 g/dL   AST 13 (L) 15 - 41 U/L   ALT 8 (L) 14 - 54 U/L   Alkaline Phosphatase 56 38 - 126 U/L   Total Bilirubin 0.3 0.3 - 1.2 mg/dL   GFR calc non Af Amer >60 >60 mL/min   GFR calc Af Amer >60 >60 mL/min   Anion gap 7 5 - 15  Lipase, blood  Result Value  Ref Range   Lipase 26 22 - 51 U/L  CBC with Differential  Result Value Ref Range   WBC 9.7 4.0 - 10.5 K/uL   RBC 3.38 (L) 3.87 - 5.11 MIL/uL   Hemoglobin 10.6 (L) 12.0 - 15.0 g/dL   HCT 16.1 (L) 09.6 - 04.5 %   MCV 92.3 78.0 - 100.0 fL   MCH 31.4 26.0 - 34.0 pg   MCHC 34.0 30.0 - 36.0 g/dL   RDW 40.9 81.1 - 91.4 %   Platelets 268 150 - 400 K/uL   Neutrophils Relative % 67 43 - 77 %   Neutro Abs 6.6 1.7 - 7.7 K/uL   Lymphocytes Relative 25 12 - 46 %   Lymphs Abs 2.4 0.7 - 4.0 K/uL   Monocytes Relative 6 3 - 12 %   Monocytes Absolute 0.6 0.1 - 1.0 K/uL   Eosinophils Relative 2 0 - 5 %   Eosinophils Absolute 0.2 0.0 - 0.7 K/uL   Basophils Relative 0 0 - 1 %   Basophils Absolute 0.0 0.0 - 0.1 K/uL  Urinalysis, Routine w reflex microscopic  Result Value Ref Range   Color, Urine YELLOW YELLOW   APPearance CLEAR CLEAR   Specific Gravity, Urine 1.020 1.005 - 1.030   pH 6.0 5.0 - 8.0   Glucose, UA NEGATIVE NEGATIVE mg/dL   Hgb  urine dipstick NEGATIVE NEGATIVE   Bilirubin Urine NEGATIVE NEGATIVE   Ketones, ur NEGATIVE NEGATIVE mg/dL   Protein, ur NEGATIVE NEGATIVE mg/dL   Urobilinogen, UA 0.2 0.0 - 1.0 mg/dL   Nitrite NEGATIVE NEGATIVE   Leukocytes, UA NEGATIVE NEGATIVE  Urine rapid drug screen (hosp performed)  Result Value Ref Range   Opiates POSITIVE (A) NONE DETECTED   Cocaine NONE DETECTED NONE DETECTED   Benzodiazepines NONE DETECTED NONE DETECTED   Amphetamines NONE DETECTED NONE DETECTED   Tetrahydrocannabinol NONE DETECTED NONE DETECTED   Barbiturates NONE DETECTED NONE DETECTED       A/P:  Admit for obs Plan OB Ultrasound

## 2015-05-02 NOTE — Progress Notes (Signed)
Pt comint to antenatal unit for 23 hour obs by Dr Renaldo FiddlerAdkins

## 2015-05-02 NOTE — Progress Notes (Signed)
Pt continues to c/o upper abdominal & epigastric pain.  No n/v.  No constipation.  Diarrhea has resolved.  Good FM.  No VB, ctx, or LOF.    AF, VSS Gen - NAD Abd - + epigastric tenderness, no fundal tenderness Ext - NT, no edema  US - normal growth & fluid.  No evidence of abruption.  A/P:  Epigastric pain Repeat CBC & CMET Protonix Plan d/c home with outpt f/u if labs wnl.

## 2015-05-02 NOTE — ED Notes (Signed)
Patient sitting up in bed talking on cellphone

## 2015-05-02 NOTE — Progress Notes (Signed)
Pt is 1049w1d. G2P1 Pt has been monitored x1 hour. FHR reassuring. Occasional UC's noted. [facility]. Contact number for Dr Renaldo FiddlerAdkins or [facility] given.

## 2015-05-02 NOTE — ED Notes (Signed)
Report given to [facility]  

## 2015-05-02 NOTE — ED Notes (Signed)
[facility] that baby is reassuring and patient is having a few occasional contractions.

## 2015-05-02 NOTE — ED Notes (Signed)
Pt. C/o abdominal pain. Pt. Reports being instructed by OB to come to ED for evaluation.

## 2015-05-05 NOTE — Discharge Summary (Signed)
Obstetric Discharge Summary Reason for Admission: observation/evaluation Prenatal Procedures: ultrasound Intrapartum Procedures: na Postpartum Procedures: na Complications-Operative and Postpartum: na HEMOGLOBIN  Date Value Ref Range Status  05/02/2015 9.6* 12.0 - 15.0 g/dL Final  01/02/725311/17/2015 66.413.1 12.2 - 16.2 g/dL Final   HCT  Date Value Ref Range Status  05/02/2015 28.5* 36.0 - 46.0 % Final   HCT, POC  Date Value Ref Range Status  11/03/2014 39.1 37.7 - 47.9 % Final    Physical Exam:  General: alert and cooperative Lochia: na Uterine Fundus: gravid Incision: na DVT Evaluation: No evidence of DVT seen on physical exam. Negative Homan's sign. No cords or calf tenderness.  Discharge Diagnoses: iup  @ 27 weeks with epigastric pain  Discharge Information: Date: 05/05/2015 Activity: unrestricted Diet: routine Medications: PNV Condition: stable Instructions: call office for follow up appointment. Call for ROM, vag bleeding or decreased FM. also call for nausea and vomiting or increase in pain Discharge to: home Follow-up Information    Schedule an appointment as soon as possible for a visit in 1 week to follow up.      Newborn Data: This patient has no babies on file. Home with na.  Nicole [name] G 05/05/2015, 8:42 AM

## 2015-07-20 LAB — OB RESULTS CONSOLE GBS: GBS: NEGATIVE

## 2015-07-21 ENCOUNTER — Inpatient Hospital Stay (HOSPITAL_COMMUNITY)
Admission: AD | Admit: 2015-07-21 | Discharge: 2015-07-22 | Disposition: A | Discharge status: Home / Self Care | Payer: BLUE CROSS/BLUE SHIELD | Source: Ambulatory Visit | Attending: Obstetrics and Gynecology | Admitting: Obstetrics and Gynecology

## 2015-07-21 DIAGNOSIS — O9989 Other specified diseases and conditions complicating pregnancy, childbirth and the puerperium: Secondary | ICD-10-CM | POA: Diagnosis present

## 2015-07-21 DIAGNOSIS — Z3A36 36 weeks gestation of pregnancy: Secondary | ICD-10-CM | POA: Diagnosis not present

## 2015-07-22 ENCOUNTER — Encounter (HOSPITAL_COMMUNITY): Payer: Self-pay | Admitting: *Deleted

## 2015-07-22 LAB — URINALYSIS, ROUTINE W REFLEX MICROSCOPIC
BILIRUBIN URINE: NEGATIVE
Glucose, UA: NEGATIVE mg/dL
HGB URINE DIPSTICK: NEGATIVE
KETONES UR: NEGATIVE mg/dL
Leukocytes, UA: NEGATIVE
Nitrite: NEGATIVE
Protein, ur: NEGATIVE mg/dL
Specific Gravity, Urine: >1.03 — ABNORMAL HIGH (ref 1.005–1.030)
Urobilinogen, UA: 0.2 mg/dL (ref 0.0–1.0)
pH: 6 (ref 5.0–8.0)

## 2015-07-22 LAB — POCT FERN TEST: POCT FERN TEST: NEGATIVE

## 2015-07-22 NOTE — MAU Provider Note (Signed)
S: Nicole [name] is a 24 y.o. G2P1001 at [redacted]w[redacted]d who presents today with leaking of fluid. She states she had some leaking around 2230, but has not had any since. She reports pelvic pressure and occasional contractions. She states that she was checked today and was 2/50 in the office. She denies any VB. She denies any fever, nausea/vomiting of dysuria. She confirms fetal movement. O: VSS, afebrile Abdomen: soft, non-tender, gravid External: no lesion Vagina: small amount of white discharge. No pooling  Cervix: pink, smooth, no fluid seen with valsalva  Uterus: AGA FHT: 140, moderate with 15x15 accels, no decels Toco: occasional contraction  Results for orders placed or performed during the hospital encounter of 07/21/15 (from the past 24 hour(s))  Urinalysis, Routine w reflex microscopic (not at [facility])     Status: Abnormal   Collection Time: 07/21/15 11:59 PM  Result Value Ref Range   Color, Urine YELLOW YELLOW   APPearance CLEAR CLEAR   Specific Gravity, Urine >1.030 (H) 1.005 - 1.030   pH 6.0 5.0 - 8.0   Glucose, UA NEGATIVE NEGATIVE mg/dL   Hgb urine dipstick NEGATIVE NEGATIVE   Bilirubin Urine NEGATIVE NEGATIVE   Ketones, ur NEGATIVE NEGATIVE mg/dL   Protein, ur NEGATIVE NEGATIVE mg/dL   Urobilinogen, UA 0.2 0.0 - 1.0 mg/dL   Nitrite NEGATIVE NEGATIVE   Leukocytes, UA NEGATIVE NEGATIVE   A/P: Exam for ROM RN will report to attending MD

## 2015-07-22 NOTE — MAU Note (Signed)
Pt had some lof since 2230

## 2015-07-22 NOTE — Discharge Instructions (Signed)
Braxton Hicks Contractions °Contractions of the uterus can occur throughout pregnancy. Contractions are not always a sign that you are in labor.  °WHAT ARE BRAXTON HICKS CONTRACTIONS?  °Contractions that occur before labor are called Braxton Hicks contractions, or false labor. Toward the end of pregnancy (32-34 weeks), these contractions can develop more often and may become more forceful. This is not true labor because these contractions do not result in opening (dilatation) and thinning of the cervix. They are sometimes difficult to tell apart from true labor because these contractions can be forceful and people have different pain tolerances. You should not feel embarrassed if you go to the hospital with false labor. Sometimes, the only way to tell if you are in true labor is for your health care provider to look for changes in the cervix. °If there are no prenatal problems or other health problems associated with the pregnancy, it is completely safe to be sent home with false labor and await the onset of true labor. °HOW CAN YOU TELL THE DIFFERENCE BETWEEN TRUE AND FALSE LABOR? °False Labor °· The contractions of false labor are usually shorter and not as hard as those of true labor.   °· The contractions are usually irregular.   °· The contractions are often felt in the front of the lower abdomen and in the groin.   °· The contractions may go away when you walk around or change positions while lying down.   °· The contractions get weaker and are shorter lasting as time goes on.   °· The contractions do not usually become progressively stronger, regular, and closer together as with true labor.   °True Labor °· Contractions in true labor last 30-70 seconds, become very regular, usually become more intense, and increase in frequency.   °· The contractions do not go away with walking.   °· The discomfort is usually felt in the top of the uterus and spreads to the lower abdomen and low back.   °· True labor can be  determined by your health care provider with an exam. This will show that the cervix is dilating and getting thinner.   °WHAT TO REMEMBER °· Keep up with your usual exercises and follow other instructions given by your health care provider.   °· Take medicines as directed by your health care provider.   °· Keep your regular prenatal appointments.   °· Eat and drink lightly if you think you are going into labor.   °· If Braxton Hicks contractions are making you uncomfortable:   °¨ Change your position from lying down or resting to walking, or from walking to resting.   °¨ Sit and rest in a tub of warm water.   °¨ Drink 2-3 glasses of water. Dehydration may cause these contractions.   °¨ Do slow and deep breathing several times an hour.   °WHEN SHOULD I SEEK IMMEDIATE MEDICAL CARE? °Seek immediate medical care if: °· Your contractions become stronger, more regular, and closer together.   °· You have fluid leaking or gushing from your vagina.   °· You have a fever.   °· You pass blood-tinged mucus.   °· You have vaginal bleeding.   °· You have continuous abdominal pain.   °· You have low back pain that you never had before.   °· You feel your baby's head pushing down and causing pelvic pressure.   °· Your baby is not moving as much as it used to.   °Document Released: 12/04/2005 Document Revised: 12/09/2013 Document Reviewed: 09/15/2013 °[employer]® Patient Information ©2015 [employer]. This information is not intended to replace advice given to you by your health care   provider. Make sure you discuss any questions you have with your health care provider. ° °

## 2015-07-24 ENCOUNTER — Encounter (HOSPITAL_COMMUNITY): Payer: Self-pay

## 2015-07-24 ENCOUNTER — Inpatient Hospital Stay (HOSPITAL_COMMUNITY)
Admission: AD | Admit: 2015-07-24 | Discharge: 2015-07-24 | Disposition: A | Discharge status: Home / Self Care | Payer: BLUE CROSS/BLUE SHIELD | Source: Ambulatory Visit | Attending: Obstetrics and Gynecology | Admitting: Obstetrics and Gynecology

## 2015-07-24 DIAGNOSIS — O9989 Other specified diseases and conditions complicating pregnancy, childbirth and the puerperium: Secondary | ICD-10-CM | POA: Insufficient documentation

## 2015-07-24 DIAGNOSIS — Z3A37 37 weeks gestation of pregnancy: Secondary | ICD-10-CM | POA: Diagnosis not present

## 2015-07-24 LAB — POCT FERN TEST: POCT FERN TEST: NEGATIVE

## 2015-07-24 NOTE — MAU Provider Note (Signed)
Sybilla Malhotra is a 24 y.o. G2P1001 at [redacted]w[redacted]d here reporting leaking fluid. RN requests speculum exam for rule out SROM only.   O: Spec: negative for pool Fern: negative  FHR: 130 bpm, variability: moderate,  accelerations:  Present,  decelerations:  Absent  A/P: intact membranes RN to report to MD for plan of care   Rosey Eide 3:54 PM 07/24/2015

## 2015-07-24 NOTE — MAU Note (Signed)
Pt states she got out of car and had water run down both legs. Soaked through pad. Pt also states back cramping and report fetal movement.

## 2015-08-01 ENCOUNTER — Encounter (HOSPITAL_COMMUNITY): Payer: Self-pay | Admitting: *Deleted

## 2015-08-01 ENCOUNTER — Inpatient Hospital Stay (HOSPITAL_COMMUNITY)
Admission: AD | Admit: 2015-08-01 | Discharge: 2015-08-01 | Disposition: A | Discharge status: Home / Self Care | Payer: BLUE CROSS/BLUE SHIELD | Source: Ambulatory Visit | Attending: Obstetrics and Gynecology | Admitting: Obstetrics and Gynecology

## 2015-08-01 DIAGNOSIS — Z3A38 38 weeks gestation of pregnancy: Secondary | ICD-10-CM | POA: Insufficient documentation

## 2015-08-01 MED ORDER — OXYCODONE-ACETAMINOPHEN 5-325 MG PO TABS: 1.0000 | ORAL_TABLET | Freq: Four times a day (QID) | ORAL | Status: DC | PRN

## 2015-08-01 NOTE — MAU Provider Note (Signed)
Patient presents to MAU today for labor eval. RN discussed with Dr. Renaldo Fiddler. Dr. Renaldo Fiddler recommends Rx for Percocet #6 to be given to patient at time of discharge.   Rx printed and given to RN.   Medical screening exam complete  Marny Lowenstein, Cordelia Poche 08/01/2015 7:38 PM

## 2015-08-01 NOTE — Discharge Instructions (Signed)
Braxton Hicks Contractions °Contractions of the uterus can occur throughout pregnancy. Contractions are not always a sign that you are in labor.  °WHAT ARE BRAXTON HICKS CONTRACTIONS?  °Contractions that occur before labor are called Braxton Hicks contractions, or false labor. Toward the end of pregnancy (32-34 weeks), these contractions can develop more often and may become more forceful. This is not true labor because these contractions do not result in opening (dilatation) and thinning of the cervix. They are sometimes difficult to tell apart from true labor because these contractions can be forceful and people have different pain tolerances. You should not feel embarrassed if you go to the hospital with false labor. Sometimes, the only way to tell if you are in true labor is for your health care provider to look for changes in the cervix. °If there are no prenatal problems or other health problems associated with the pregnancy, it is completely safe to be sent home with false labor and await the onset of true labor. °HOW CAN YOU TELL THE DIFFERENCE BETWEEN TRUE AND FALSE LABOR? °False Labor °· The contractions of false labor are usually shorter and not as hard as those of true labor.   °· The contractions are usually irregular.   °· The contractions are often felt in the front of the lower abdomen and in the groin.   °· The contractions may go away when you walk around or change positions while lying down.   °· The contractions get weaker and are shorter lasting as time goes on.   °· The contractions do not usually become progressively stronger, regular, and closer together as with true labor.   °True Labor °· Contractions in true labor last 30-70 seconds, become very regular, usually become more intense, and increase in frequency.   °· The contractions do not go away with walking.   °· The discomfort is usually felt in the top of the uterus and spreads to the lower abdomen and low back.   °· True labor can be  determined by your health care provider with an exam. This will show that the cervix is dilating and getting thinner.   °WHAT TO REMEMBER °· Keep up with your usual exercises and follow other instructions given by your health care provider.   °· Take medicines as directed by your health care provider.   °· Keep your regular prenatal appointments.   °· Eat and drink lightly if you think you are going into labor.   °· If Braxton Hicks contractions are making you uncomfortable:   °¨ Change your position from lying down or resting to walking, or from walking to resting.   °¨ Sit and rest in a tub of warm water.   °¨ Drink 2-3 glasses of water. Dehydration may cause these contractions.   °¨ Do slow and deep breathing several times an hour.   °WHEN SHOULD I SEEK IMMEDIATE MEDICAL CARE? °Seek immediate medical care if: °· Your contractions become stronger, more regular, and closer together.   °· You have fluid leaking or gushing from your vagina.   °· You have a fever.   °· You pass blood-tinged mucus.   °· You have vaginal bleeding.   °· You have continuous abdominal pain.   °· You have low back pain that you never had before.   °· You feel your baby's head pushing down and causing pelvic pressure.   °· Your baby is not moving as much as it used to.   °Document Released: 12/04/2005 Document Revised: 12/09/2013 Document Reviewed: 09/15/2013 °[employer]® Patient Information ©2015 [employer]. This information is not intended to replace advice given to you by your health care   provider. Make sure you discuss any questions you have with your health care provider. ° °

## 2015-08-05 ENCOUNTER — Inpatient Hospital Stay (HOSPITAL_COMMUNITY): Payer: BLUE CROSS/BLUE SHIELD | Admitting: Anesthesiology

## 2015-08-05 ENCOUNTER — Inpatient Hospital Stay (HOSPITAL_COMMUNITY)
Admission: AD | Admit: 2015-08-05 | Discharge: 2015-08-07 | DRG: 775 | Disposition: A | Discharge status: Home / Self Care | Payer: BLUE CROSS/BLUE SHIELD | Source: Ambulatory Visit | Attending: Obstetrics and Gynecology | Admitting: Obstetrics and Gynecology

## 2015-08-05 ENCOUNTER — Encounter (HOSPITAL_COMMUNITY): Payer: Self-pay

## 2015-08-05 DIAGNOSIS — Z809 Family history of malignant neoplasm, unspecified: Secondary | ICD-10-CM | POA: Diagnosis not present

## 2015-08-05 DIAGNOSIS — M419 Scoliosis, unspecified: Secondary | ICD-10-CM | POA: Diagnosis present

## 2015-08-05 DIAGNOSIS — Z87891 Personal history of nicotine dependence: Secondary | ICD-10-CM | POA: Diagnosis not present

## 2015-08-05 DIAGNOSIS — Z3A38 38 weeks gestation of pregnancy: Secondary | ICD-10-CM | POA: Diagnosis present

## 2015-08-05 DIAGNOSIS — IMO0001 Reserved for inherently not codable concepts without codable children: Secondary | ICD-10-CM

## 2015-08-05 DIAGNOSIS — F329 Major depressive disorder, single episode, unspecified: Secondary | ICD-10-CM | POA: Diagnosis present

## 2015-08-05 DIAGNOSIS — Z833 Family history of diabetes mellitus: Secondary | ICD-10-CM | POA: Diagnosis not present

## 2015-08-05 DIAGNOSIS — F419 Anxiety disorder, unspecified: Secondary | ICD-10-CM | POA: Diagnosis present

## 2015-08-05 DIAGNOSIS — O99344 Other mental disorders complicating childbirth: Principal | ICD-10-CM | POA: Diagnosis present

## 2015-08-05 LAB — TYPE AND SCREEN
ABO/RH(D): O POS
Antibody Screen: NEGATIVE

## 2015-08-05 LAB — HIV ANTIBODY (ROUTINE TESTING W REFLEX): HIV Screen 4th Generation wRfx: NONREACTIVE

## 2015-08-05 LAB — CBC
HCT: 31 % — ABNORMAL LOW (ref 36.0–46.0)
HEMOGLOBIN: 10.6 g/dL — AB (ref 12.0–15.0)
MCH: 31.1 pg (ref 26.0–34.0)
MCHC: 34.2 g/dL (ref 30.0–36.0)
MCV: 90.9 fL (ref 78.0–100.0)
PLATELETS: 298 10*3/uL (ref 150–400)
RBC: 3.41 MIL/uL — ABNORMAL LOW (ref 3.87–5.11)
RDW: 13.7 % (ref 11.5–15.5)
WBC: 16.2 10*3/uL — ABNORMAL HIGH (ref 4.0–10.5)

## 2015-08-05 LAB — RPR: RPR Ser Ql: NONREACTIVE

## 2015-08-05 LAB — ABO/RH: ABO/RH(D): O POS

## 2015-08-05 MED ORDER — OXYCODONE-ACETAMINOPHEN 5-325 MG PO TABS: 1.0000 | ORAL_TABLET | ORAL | Status: DC | PRN

## 2015-08-05 MED ORDER — TERBUTALINE SULFATE 1 MG/ML IJ SOLN
0.2500 mg | Freq: Once | INTRAMUSCULAR | Status: DC | PRN
  Filled 2015-08-05: qty 1

## 2015-08-05 MED ORDER — BUPIVACAINE HCL (PF) 0.25 % IJ SOLN
INTRAMUSCULAR | Status: DC | PRN
  Administered 2015-08-05 (×2): 4 mL

## 2015-08-05 MED ORDER — LACTATED RINGERS IV SOLN: 500.0000 mL | INTRAVENOUS | Status: DC | PRN

## 2015-08-05 MED ORDER — BENZOCAINE-MENTHOL 20-0.5 % EX AERO
1.0000 "application " | INHALATION_SPRAY | CUTANEOUS | Status: DC | PRN
  Administered 2015-08-06: 1 via TOPICAL
  Filled 2015-08-05: qty 56

## 2015-08-05 MED ORDER — MEASLES, MUMPS & RUBELLA VAC ~~LOC~~ INJ
0.5000 mL | INJECTION | Freq: Once | SUBCUTANEOUS | Status: DC
  Filled 2015-08-05: qty 0.5

## 2015-08-05 MED ORDER — MEDROXYPROGESTERONE ACETATE 150 MG/ML IM SUSP: 150.0000 mg | INTRAMUSCULAR | Status: DC | PRN

## 2015-08-05 MED ORDER — ACETAMINOPHEN 325 MG PO TABS: 650.0000 mg | ORAL_TABLET | ORAL | Status: DC | PRN

## 2015-08-05 MED ORDER — LACTATED RINGERS IV SOLN
INTRAVENOUS | Status: DC
  Administered 2015-08-05: 08:00:00 via INTRAVENOUS

## 2015-08-05 MED ORDER — EPHEDRINE 5 MG/ML INJ
10.0000 mg | INTRAVENOUS | Status: DC | PRN
  Filled 2015-08-05: qty 2

## 2015-08-05 MED ORDER — DIPHENHYDRAMINE HCL 50 MG/ML IJ SOLN
12.5000 mg | INTRAMUSCULAR | Status: DC | PRN
Start: 2015-08-05 — End: 2015-08-05

## 2015-08-05 MED ORDER — DIPHENHYDRAMINE HCL 25 MG PO CAPS: 25.0000 mg | ORAL_CAPSULE | Freq: Four times a day (QID) | ORAL | Status: DC | PRN

## 2015-08-05 MED ORDER — PRENATAL MULTIVITAMIN CH
1.0000 | ORAL_TABLET | Freq: Every day | ORAL | Status: DC
  Administered 2015-08-06: 1 via ORAL
  Filled 2015-08-05: qty 1

## 2015-08-05 MED ORDER — FENTANYL 2.5 MCG/ML BUPIVACAINE 1/10 % EPIDURAL INFUSION (WH - ANES)
14.0000 mL/h | INTRAMUSCULAR | Status: DC | PRN
  Administered 2015-08-05: 12 mL/h via EPIDURAL
  Filled 2015-08-05: qty 125

## 2015-08-05 MED ORDER — IBUPROFEN 600 MG PO TABS
600.0000 mg | ORAL_TABLET | Freq: Four times a day (QID) | ORAL | Status: DC
  Administered 2015-08-05 – 2015-08-07 (×7): 600 mg via ORAL
  Filled 2015-08-05 (×7): qty 1

## 2015-08-05 MED ORDER — DIBUCAINE 1 % RE OINT: 1.0000 "application " | TOPICAL_OINTMENT | RECTAL | Status: DC | PRN

## 2015-08-05 MED ORDER — OXYCODONE-ACETAMINOPHEN 5-325 MG PO TABS: 2.0000 | ORAL_TABLET | ORAL | Status: DC | PRN

## 2015-08-05 MED ORDER — ZOLPIDEM TARTRATE 5 MG PO TABS: 5.0000 mg | ORAL_TABLET | Freq: Every evening | ORAL | Status: DC | PRN

## 2015-08-05 MED ORDER — LANOLIN HYDROUS EX OINT
TOPICAL_OINTMENT | CUTANEOUS | Status: DC | PRN
Start: 2015-08-05 — End: 2015-08-07

## 2015-08-05 MED ORDER — OXYCODONE-ACETAMINOPHEN 5-325 MG PO TABS
2.0000 | ORAL_TABLET | ORAL | Status: DC | PRN
  Administered 2015-08-05 – 2015-08-07 (×7): 2 via ORAL
  Filled 2015-08-05 (×6): qty 2

## 2015-08-05 MED ORDER — ONDANSETRON HCL 4 MG/2ML IJ SOLN
4.0000 mg | INTRAMUSCULAR | Status: DC | PRN
Start: 2015-08-05 — End: 2015-08-07

## 2015-08-05 MED ORDER — SIMETHICONE 80 MG PO CHEW: 80.0000 mg | CHEWABLE_TABLET | ORAL | Status: DC | PRN

## 2015-08-05 MED ORDER — TETANUS-DIPHTH-ACELL PERTUSSIS 5-2.5-18.5 LF-MCG/0.5 IM SUSP: 0.5000 mL | Freq: Once | INTRAMUSCULAR | Status: DC

## 2015-08-05 MED ORDER — OXYTOCIN 40 UNITS IN LACTATED RINGERS INFUSION - SIMPLE MED
1.0000 m[IU]/min | INTRAVENOUS | Status: DC
  Administered 2015-08-05: 2 m[IU]/min via INTRAVENOUS

## 2015-08-05 MED ORDER — LIDOCAINE HCL (PF) 1 % IJ SOLN
30.0000 mL | INTRAMUSCULAR | Status: DC | PRN
  Filled 2015-08-05: qty 30

## 2015-08-05 MED ORDER — WITCH HAZEL-GLYCERIN EX PADS: 1.0000 "application " | MEDICATED_PAD | CUTANEOUS | Status: DC | PRN

## 2015-08-05 MED ORDER — OXYTOCIN BOLUS FROM INFUSION: 500.0000 mL | INTRAVENOUS | Status: DC

## 2015-08-05 MED ORDER — SENNOSIDES-DOCUSATE SODIUM 8.6-50 MG PO TABS
2.0000 | ORAL_TABLET | ORAL | Status: DC
  Administered 2015-08-06 (×2): 2 via ORAL
  Filled 2015-08-05 (×2): qty 2

## 2015-08-05 MED ORDER — ONDANSETRON HCL 4 MG/2ML IJ SOLN: 4.0000 mg | Freq: Four times a day (QID) | INTRAMUSCULAR | Status: DC | PRN

## 2015-08-05 MED ORDER — OXYTOCIN 40 UNITS IN LACTATED RINGERS INFUSION - SIMPLE MED
62.5000 mL/h | INTRAVENOUS | Status: DC
Start: 2015-08-05 — End: 2015-08-05
  Filled 2015-08-05: qty 1000

## 2015-08-05 MED ORDER — CITRIC ACID-SODIUM CITRATE 334-500 MG/5ML PO SOLN: 30.0000 mL | ORAL | Status: DC | PRN

## 2015-08-05 MED ORDER — PHENYLEPHRINE 40 MCG/ML (10ML) SYRINGE FOR IV PUSH (FOR BLOOD PRESSURE SUPPORT)
80.0000 ug | PREFILLED_SYRINGE | INTRAVENOUS | Status: DC | PRN
  Filled 2015-08-05: qty 20
  Filled 2015-08-05: qty 2

## 2015-08-05 MED ORDER — LIDOCAINE-EPINEPHRINE (PF) 2 %-1:200000 IJ SOLN
INTRAMUSCULAR | Status: DC | PRN
  Administered 2015-08-05: 4 mL

## 2015-08-05 MED ORDER — OXYCODONE-ACETAMINOPHEN 5-325 MG PO TABS
1.0000 | ORAL_TABLET | ORAL | Status: DC | PRN
  Administered 2015-08-05 (×2): 1 via ORAL
  Filled 2015-08-05 (×4): qty 1

## 2015-08-05 MED ORDER — ONDANSETRON HCL 4 MG PO TABS: 4.0000 mg | ORAL_TABLET | ORAL | Status: DC | PRN

## 2015-08-05 NOTE — Lactation Note (Signed)
This note was copied from the chart of Nicole Vincenza Sperbeck. Lactation Consultation Note  P2, Older sibling had cleft palate so mother pumped for 4 months. Baby latched in cradle position upon entering. Denies soreness or questions.  Reviewed cluster feeding and basics. Mom encouraged to feed baby 8-12 times/24 hours and with feeding cues.  Mom made aware of O/P services, breastfeeding support groups, community resources, and our phone # for post-discharge questions.     Patient Name: Nicole [name] [id number] Date: 08/05/2015 Reason for consult: Initial assessment   Maternal Data Has patient been taught Hand Expression?: Yes Does the patient have breastfeeding experience prior to this delivery?: Yes (older sibling cleft palalte mother pumped for 4 months)  Feeding Feeding Type: Breast Fed (latched upon entering)  LATCH Score/Interventions Latch: Grasps breast easily, tongue down, lips flanged, rhythmical sucking. (latched upon entering)  Audible Swallowing: A few with stimulation  Type of Nipple: Everted at rest and after stimulation  Comfort (Breast/Nipple): Soft / non-tender     Hold (Positioning): Assistance needed to correctly position infant at breast and maintain latch.  LATCH Score: 8  Lactation Tools Discussed/Used     Consult Status Consult Status: Follow-up Date: 08/06/15 Follow-up type: In-patient    [name] [facility] 08/05/2015, 5:45 PM

## 2015-08-05 NOTE — Anesthesia Postprocedure Evaluation (Signed)
Anesthesia Post Note  Patient: Nicole [name]  Procedure(s) Performed: * No procedures listed *  Anesthesia type: Epidural  Patient location: Mother/Baby  Post pain: Pain level controlled  Post assessment: Post-op Vital signs reviewed  Last Vitals:  Filed Vitals:   08/05/15 1316  BP: 119/65  Pulse: 61  Temp:   Resp:     Post vital signs: Reviewed  Level of consciousness:alert  Complications: No apparent anesthesia complications

## 2015-08-05 NOTE — Progress Notes (Signed)
MOB was referred for history of depression/anxiety.  Referral is screened out by Clinical Social Worker because none of the following criteria appear to apply: -History of anxiety/depression during this pregnancy, or of post-partum depression. - Diagnosis of anxiety and/or depression within last 3 years or -MOB's symptoms are currently being treated with medication and/or therapy.  Per chart review, symptoms occurred when MOB was a teenager. No concerns documented during the pregnancy.   Please contact the Clinical Social Worker if needs arise or upon MOB request.

## 2015-08-05 NOTE — Progress Notes (Signed)
Patient ID: Nicole [name], female   DOB: [birth year], 24 y.o.   MRN: [id number] Pt comfortable with epidural VSSAF FHR 130 Cat 1 Cx 6/c/-2  AROM clear fluid  Anticipate SVD DL

## 2015-08-05 NOTE — Anesthesia Preprocedure Evaluation (Signed)
Anesthesia Evaluation  Patient identified by MRN, date of birth, ID band Patient awake    Reviewed: Allergy & Precautions, Patient's Chart, lab work & pertinent test results  History of Anesthesia Complications Negative for: history of anesthetic complications  Airway Mallampati: II  TM Distance: >3 FB Neck ROM: Full    Dental  (+) Teeth Intact   Pulmonary former smoker,  breath sounds clear to auscultation        Cardiovascular negative cardio ROS  Rhythm:Regular     Neuro/Psych PSYCHIATRIC DISORDERS Depression negative neurological ROS     GI/Hepatic negative GI ROS, Neg liver ROS,   Endo/Other  negative endocrine ROS  Renal/GU negative Renal ROS     Musculoskeletal   Abdominal   Peds  Hematology negative hematology ROS (+)   Anesthesia Other Findings   Reproductive/Obstetrics (+) Pregnancy                             Anesthesia Physical Anesthesia Plan  ASA: II  Anesthesia Plan: Epidural   Post-op Pain Management:    Induction:   Airway Management Planned:   Additional Equipment:   Intra-op Plan:   Post-operative Plan:   Informed Consent: I have reviewed the patients History and Physical, chart, labs and discussed the procedure including the risks, benefits and alternatives for the proposed anesthesia with the patient or authorized representative who has indicated his/her understanding and acceptance.   Dental advisory given  Plan Discussed with: Anesthesiologist  Anesthesia Plan Comments:         Anesthesia Quick Evaluation

## 2015-08-05 NOTE — H&P (Signed)
Nicole [name] is a 24 y.o. G 2 P 1 at 48 w 5 days presents in active labor. Maternal Medical History:  Reason for admission: Contractions.   Fetal activity: Perceived fetal activity is none.      OB History    Gravida Para Term Preterm AB TAB SAB Ectopic Multiple Living   Past Medical History  Diagnosis Date  . Hypoglycemia   . Scoliosis   . Syncope   . Infection     UTI  . Depression     hx age 42, fine now   Past Surgical History  Procedure Laterality Date  . Mouth surgery     Family History: family history includes Cancer in her maternal grandmother; Cancer (age of onset: 9) in her mother; Diabetes in her paternal grandfather; Otilio Jefferson sequence in her son. Social History:  reports that she quit smoking about 2 years ago. Her smoking use included Cigarettes. She has never used smokeless tobacco. She reports that she does not drink alcohol or use illicit drugs.   Prenatal Transfer Tool  Maternal Diabetes: No Genetic Screening: Normal Maternal Ultrasounds/Referrals: Normal Fetal Ultrasounds or other Referrals:  None Maternal Substance Abuse:  No Significant Maternal Medications:  None Significant Maternal Lab Results:  None Other Comments:  None  Review of Systems  All other systems reviewed and are negative.     Blood pressure 115/75, pulse 84, temperature 98.4 F (36.9 C), temperature source Oral, resp. rate 15, height  (1.753 m), weight 71.668 kg (158 lb), last menstrual period 11/01/2014, SpO2 100 %. Maternal Exam:  Abdomen: Fetal presentation: vertex     Fetal Exam Fetal State Assessment: Category I - tracings are normal.     Physical Exam  Nursing note and vitals reviewed. Constitutional: She appears well-developed.  HENT:  Head: Normocephalic.  Eyes: Pupils are equal, round, and reactive to light.  Cardiovascular: Normal rate and regular rhythm.   Respiratory: Effort normal.  GI: Soft.    Prenatal labs: ABO,  Rh:   Antibody:   Rubella:   RPR:    HBsAg:    HIV:    GBS:     Assessment/Plan: IUP at 38 w 5 days Labor Epidural Anticipate NSVD   [name] 08/05/2015, 4:41 AM

## 2015-08-05 NOTE — MAU Note (Signed)
Pt presents with contractions since 1:45 this am, denies ROM or bleeding.

## 2015-08-05 NOTE — Anesthesia Procedure Notes (Signed)
Epidural Patient location during procedure: OB  Staffing Anesthesiologist: Irais Mottram, CHRIS Performed by: anesthesiologist   Preanesthetic Checklist Completed: patient identified, surgical consent, pre-op evaluation, timeout performed, IV checked, risks and benefits discussed and monitors and equipment checked  Epidural Patient position: sitting Prep: DuraPrep Patient monitoring: heart rate, cardiac monitor, continuous pulse ox and blood pressure Approach: midline Location: L3-L4 Injection technique: LOR saline  Needle:  Needle type: Tuohy  Needle gauge: 17 G Needle length: 9 cm Needle insertion depth: 6 cm Catheter type: closed end flexible Catheter size: 19 Gauge Catheter at skin depth: 12 cm Test dose: negative and 2% lidocaine with Epi 1:200 K  Assessment Events: blood not aspirated, injection not painful, no injection resistance, negative IV test and no paresthesia  Additional Notes Reason for block:procedure for pain   

## 2015-08-06 LAB — CBC
HCT: 24.7 % — ABNORMAL LOW (ref 36.0–46.0)
HEMOGLOBIN: 8.4 g/dL — AB (ref 12.0–15.0)
MCH: 31.1 pg (ref 26.0–34.0)
MCHC: 34 g/dL (ref 30.0–36.0)
MCV: 91.5 fL (ref 78.0–100.0)
PLATELETS: 233 10*3/uL (ref 150–400)
RBC: 2.7 MIL/uL — AB (ref 3.87–5.11)
RDW: 13.9 % (ref 11.5–15.5)
WBC: 11.6 10*3/uL — AB (ref 4.0–10.5)

## 2015-08-06 LAB — RUBELLA SCREEN: RUBELLA: 2.34 {index} (ref >0.99)

## 2015-08-06 NOTE — Progress Notes (Signed)
Post Partum Day 1 Subjective: no complaints, up ad lib, voiding and tolerating PO  Objective: Blood pressure 98/48, pulse 62, temperature 97.9 F (36.6 C), temperature source Oral, resp. rate 16, height  (1.753 m), weight 158 lb (71.668 kg), last menstrual period 11/01/2014, SpO2 100 %, unknown if currently breastfeeding.  Physical Exam:  General: alert and cooperative Lochia: appropriate Uterine Fundus: firm Incision: perineum intact DVT Evaluation: No evidence of DVT seen on physical exam. Negative Homan's sign. No cords or calf tenderness. No significant calf/ankle edema.   Recent Labs  08/05/15 0440 08/06/15 0540  HGB 10.6* 8.4*  HCT 31.0* 24.7*    Assessment/Plan: Plan for discharge tomorrow and Circumcision prior to discharge   LOS: 1 day   Anaia Frith G 08/06/2015, 7:55 AM

## 2015-08-06 NOTE — Lactation Note (Signed)
This note was copied from the chart of Nicole [name]. Lactation Consultation Note  Baby latched upon entering.  Mother's nipple pink and tender. Encouraged her to place baby on pillow and bring to nipple height. Reassured mother about her milk supply concerns.  Mother can easily hand express good flow of colostrum. Explained cluster feeding. Provided mother w/ comfort gels.  Ebm was rubbed on nipples by Nadeen Landau. Praised mother for efforts.  Patient Name: Nicole [name] [id number] Date: 08/06/2015 Reason for consult: Follow-up assessment   Maternal Data    Feeding Feeding Type: Breast Fed Length of feed: 20 min  LATCH Score/Interventions Latch: Grasps breast easily, tongue down, lips flanged, rhythmical sucking.  Audible Swallowing: A few with stimulation  Type of Nipple: Everted at rest and after stimulation  Comfort (Breast/Nipple): Soft / non-tender     Hold (Positioning): No assistance needed to correctly position infant at breast.  LATCH Score: 9  Lactation Tools Discussed/Used     Consult Status Consult Status: Follow-up Date: 08/07/15 Follow-up type: In-patient    [name] [facility] 08/06/2015, 8:58 PM

## 2015-08-07 MED ORDER — FERROUS SULFATE 325 (65 FE) MG PO TABS: 325.0000 mg | ORAL_TABLET | Freq: Every day | ORAL | Status: DC

## 2015-08-07 MED ORDER — IBUPROFEN 600 MG PO TABS: 600.0000 mg | ORAL_TABLET | Freq: Four times a day (QID) | ORAL | Status: DC

## 2015-08-07 MED ORDER — OXYCODONE-ACETAMINOPHEN 5-325 MG PO TABS: 1.0000 | ORAL_TABLET | ORAL | Status: DC | PRN

## 2015-08-07 NOTE — Discharge Summary (Signed)
Obstetric Discharge Summary Reason for Admission: onset of labor Prenatal Procedures: none Intrapartum Procedures: spontaneous vaginal delivery Postpartum Procedures: none Complications-Operative and Postpartum: none HEMOGLOBIN  Date Value Ref Range Status  08/06/2015 8.4* 12.0 - 15.0 g/dL Final    Comment:    DELTA CHECK NOTED REPEATED TO VERIFY   11/03/2014 13.1 12.2 - 16.2 g/dL Final   HCT  Date Value Ref Range Status  08/06/2015 24.7* 36.0 - 46.0 % Final   HCT, POC  Date Value Ref Range Status  11/03/2014 39.1 37.7 - 47.9 % Final    Physical Exam:  General: alert Lochia: appropriate Uterine Fundus: firm Incision: healing well DVT Evaluation: No evidence of DVT seen on physical exam.  Discharge Diagnoses: Term Pregnancy-delivered  Discharge Information: Date: 08/07/2015 Activity: pelvic rest Diet: routine Medications: PNV, Ibuprofen, Iron and Percocet Condition: stable Instructions: refer to practice specific booklet Discharge to: home Follow-up Information    Follow up with Meriel Pica, MD. Schedule an appointment as soon as possible for a visit in 6 weeks.   Specialty:  Obstetrics and Gynecology   Contact information:   [address] [place] [address] [phone]       Newborn Data: Live born female  Birth Weight: 6 lb 5.4 oz (2875 g) APGAR: 8, 9  Home with mother.  Meriel Pica 08/07/2015, 7:33 AM

## 2015-08-07 NOTE — Lactation Note (Signed)
This note was copied from the chart of Boy Avaline Mcconnell. Lactation Consultation Note  Patient Name: Boy Evelen Vazguez [id number] Date: 08/07/2015 Reason for consult: Follow-up assessment Baby is 47 hours old , 5% weight loss, 5-15.9 oz , Breast feeding 10-27 mins , Latch scores 8-10, Voiding and stooling QS for age , mom updated LC with more voids and stools. At 36 Hours - Bili check 6.6. Per mom breast are feeding fuller and tender , initially with when baby latches - some tenderness. LC assessed breast tissue with moms permission , and noted both nipples to be healthy pink , no breakdown. Probably transient soreness, (explained to mom) , Sore nipple and engorgement prevention and tx explained to mom.  Referring to the Baby and me booklet pages 24 . Per mom will have a pump at home. LC assisted mom changing to another breast feeding position. And basic Breast feeding teaching reviewed. Multiply swallows noted , increased with breast compressions. Breast softening .  Mom willing to come back for F/U apt on Wed, 8/24 at 4 pm for LC O/P . Apt reminder given. Mother informed of post-discharge support and given phone number to the lactation department, including services for phone call assistance; out-patient appointments; and breastfeeding support group. List of other breastfeeding resources in the community given in the handout. Encouraged mother to call for problems or concerns related to breastfeeding.   Maternal Data    Feeding Feeding Type: Breast Fed Length of feed:  (still feeding at 7 mins , multiply swallows )  LATCH Score/Interventions Latch: Grasps breast easily, tongue down, lips flanged, rhythmical sucking.  Audible Swallowing: Spontaneous and intermittent  Type of Nipple: Everted at rest and after stimulation  Comfort (Breast/Nipple): Filling, red/small blisters or bruises, mild/mod discomfort     Hold (Positioning): Assistance needed to correctly position infant at  breast and maintain latch. (worked on a different position and depth ) Intervention(s): Breastfeeding basics reviewed  LATCH Score: 8  Lactation Tools Discussed/Used WIC Program: No   Consult Status Consult Status: Follow-up Date: 08/11/15 Follow-up type: Out-patient (@ 4 pm , Apt reminder given to mom )    Kathrin Greathouse 08/07/2015, 10:30 AM

## 2015-08-09 ENCOUNTER — Inpatient Hospital Stay (HOSPITAL_COMMUNITY): Admission: RE | Admit: 2015-08-09 | Payer: BLUE CROSS/BLUE SHIELD | Source: Ambulatory Visit

## 2015-08-11 ENCOUNTER — Ambulatory Visit (HOSPITAL_COMMUNITY)
Admit: 2015-08-11 | Discharge: 2015-08-11 | Disposition: A | Discharge status: Home / Self Care | Payer: BLUE CROSS/BLUE SHIELD | Attending: Obstetrics and Gynecology | Admitting: Obstetrics and Gynecology

## 2015-08-11 NOTE — Lactation Note (Signed)
Lactation Consult  Mother's reason for visit:  Help with breast feeding Visit Type:  Feeding assessment Consult:  Initial Lactation Consultant:  Audry Riles D  ________________________________________________________________________ Gender: female Gestational Age: [redacted]w[redacted]d (At Birth) Birth Weight: 6 lb 5.4 oz (2875 g) Weight at Discharge: Weight: 5 lb 15.9 oz (2720 g)Date of Discharge: 08/07/2015 [facility] Weights   08/05/15 1120 08/06/15 0009 08/06/15 2354  Weight: 6 lb 5.4 oz (2875 g) 6 lb 3.3 oz (2815 g) 5 lb 15.9 oz (2720 g)   Weight today: 2674 g  5-14.3      ________________________________________________________________________  Mother's Name: Alfredo Bach Type of delivery: vag  Breastfeeding Experience:  Pumped for a few Linzie Boursiquot, baby had cleft lip and diff latch  ________________________________________________________________________  Breastfeeding History (Post Discharge)  Frequency of breastfeeding:  q 1 1/2- 3 Duration of feeding:  15- 45 min  Supplementation          Breastmilk:  Volume   30 ml Frequency:  2 times since home from hospital   Method:  Bottle,   Pumping  Type of pump:  Evenflo Frequency:  occas Volume:   30 ml at last pumping  Infant Intake and Output Assessment  Voids:  6 in 24 hrs.  Color:  Clear yellow Stools:  6 in 24 hrs.  Color:  Green and Yellow  ________________________________________________________________________  Maternal Breast Assessment  Breast:  Filling Nipple:  Erect    _______________________________________________________________________ Feeding Assessment/Evaluation  Initial feeding assessment:  I Mom latched Cayson to right breast in cross cradle position easily by herself. Lots of swallows noted the first few minutes then he got sleepy with mostly nonnutritive sucking noted. Reviewed waking techniques with parents.     Pre-feed weight:  2674 g  (5 lb. 14.3 oz.) Post-feed weight:   2690 g (5 lb. 14.9 oz.) Amount transferred:  16 ml Amount supplemented:  0 ml  Cayson nursed on the left breast for 20 min, Again lots of swallows noted the first few minutes then he got sleepy again,.Needed much stimulation to continue nursing  Pre-feed weight: 2690 g  (5 lbs 14.9 oz) Post feed weight  2710 g (5 lbs 15.6 oz) Took 20 ml. Supplemented with 30 ml of pumped milk mom had with her.   Total amount transferred:  36 ml Total supplement given:  30 ml    Mom has Evenflo pump for home. Reports it hurts at times. Has insurance. Encouraged to call insurance company about pump to see if can get better one. Reviewed nutritive vs non nutritive sucking and encouraged to listen for swallows and watch for deep vigorous sucking. Breasts should soften after nursing. Encouraged to pump at least 4 times/day and feed all EBM to baby.No further questions at present. To see Dr Orvan Falconer on Friday

## 2017-11-11 ENCOUNTER — Emergency Department (HOSPITAL_COMMUNITY): Payer: Self-pay

## 2017-11-11 ENCOUNTER — Encounter (HOSPITAL_COMMUNITY): Payer: Self-pay | Admitting: Emergency Medicine

## 2017-11-11 ENCOUNTER — Emergency Department (HOSPITAL_COMMUNITY)
Admission: EM | Admit: 2017-11-11 | Discharge: 2017-11-11 | Disposition: A | Discharge status: Home / Self Care | Payer: Self-pay | Attending: Emergency Medicine | Admitting: Emergency Medicine

## 2017-11-11 DIAGNOSIS — R1011 Right upper quadrant pain: Secondary | ICD-10-CM | POA: Insufficient documentation

## 2017-11-11 DIAGNOSIS — R109 Unspecified abdominal pain: Secondary | ICD-10-CM

## 2017-11-11 DIAGNOSIS — Z87891 Personal history of nicotine dependence: Secondary | ICD-10-CM | POA: Insufficient documentation

## 2017-11-11 LAB — CBC WITH DIFFERENTIAL/PLATELET
BASOS ABS: 0 10*3/uL (ref 0.0–0.1)
BASOS PCT: 0 %
EOS ABS: 0.1 10*3/uL (ref 0.0–0.7)
Eosinophils Relative: 1 %
HEMATOCRIT: 36.3 % (ref 36.0–46.0)
Hemoglobin: 12.2 g/dL (ref 12.0–15.0)
Lymphocytes Relative: 10 %
Lymphs Abs: 1 10*3/uL (ref 0.7–4.0)
MCH: 30.2 pg (ref 26.0–34.0)
MCHC: 33.6 g/dL (ref 30.0–36.0)
MCV: 89.9 fL (ref 78.0–100.0)
MONO ABS: 1 10*3/uL (ref 0.1–1.0)
MONOS PCT: 10 %
NEUTROS ABS: 7.5 10*3/uL (ref 1.7–7.7)
Neutrophils Relative %: 79 %
PLATELETS: 278 10*3/uL (ref 150–400)
RBC: 4.04 MIL/uL (ref 3.87–5.11)
RDW: 13.5 % (ref 11.5–15.5)
WBC: 9.5 10*3/uL (ref 4.0–10.5)

## 2017-11-11 LAB — COMPREHENSIVE METABOLIC PANEL
ALBUMIN: 4.2 g/dL (ref 3.5–5.0)
ALT: 11 U/L — ABNORMAL LOW (ref 14–54)
ANION GAP: 5 (ref 5–15)
AST: 14 U/L — AB (ref 15–41)
Alkaline Phosphatase: 61 U/L (ref 38–126)
BILIRUBIN TOTAL: 0.6 mg/dL (ref 0.3–1.2)
BUN: 9 mg/dL (ref 6–20)
CALCIUM: 9.5 mg/dL (ref 8.9–10.3)
CHLORIDE: 102 mmol/L (ref 101–111)
CO2: 27 mmol/L (ref 22–32)
CREATININE: 0.66 mg/dL (ref 0.44–1.00)
GFR calc Af Amer: >60 mL/min (ref >60)
GFR calc non Af Amer: >60 mL/min (ref >60)
GLUCOSE: 117 mg/dL — AB (ref 65–99)
POTASSIUM: 4.5 mmol/L (ref 3.5–5.1)
Sodium: 134 mmol/L — ABNORMAL LOW (ref 135–145)
Total Protein: 7.7 g/dL (ref 6.5–8.1)

## 2017-11-11 LAB — URINALYSIS, ROUTINE W REFLEX MICROSCOPIC
Bilirubin Urine: NEGATIVE
GLUCOSE, UA: NEGATIVE mg/dL
Ketones, ur: NEGATIVE mg/dL
Nitrite: NEGATIVE
PROTEIN: NEGATIVE mg/dL
Specific Gravity, Urine: 1.006 (ref 1.005–1.030)
pH: 8 (ref 5.0–8.0)

## 2017-11-11 LAB — PREGNANCY, URINE: Preg Test, Ur: NEGATIVE

## 2017-11-11 LAB — LIPASE, BLOOD: LIPASE: 18 U/L (ref 11–51)

## 2017-11-11 MED ORDER — CEPHALEXIN 500 MG PO CAPS: 500.0000 mg | ORAL_CAPSULE | Freq: Four times a day (QID) | ORAL | 0 refills | Status: DC

## 2017-11-11 MED ORDER — SODIUM CHLORIDE 0.9 % IV BOLUS (SEPSIS)
1000.0000 mL | Freq: Once | INTRAVENOUS | Status: AC
  Administered 2017-11-11: 1000 mL via INTRAVENOUS

## 2017-11-11 MED ORDER — IOPAMIDOL (ISOVUE-300) INJECTION 61%
INTRAVENOUS | Status: DC
Start: 2017-11-11 — End: 2017-11-11
  Filled 2017-11-11: qty 100

## 2017-11-11 MED ORDER — OXYCODONE HCL 5 MG PO TABS: 5.0000 mg | ORAL_TABLET | ORAL | 0 refills | Status: DC | PRN

## 2017-11-11 MED ORDER — CEFTRIAXONE SODIUM 1 G IJ SOLR
1.0000 g | Freq: Once | INTRAMUSCULAR | Status: AC
  Administered 2017-11-11: 1 g via INTRAVENOUS
  Filled 2017-11-11: qty 10

## 2017-11-11 MED ORDER — KETOROLAC TROMETHAMINE 30 MG/ML IJ SOLN
30.0000 mg | Freq: Once | INTRAMUSCULAR | Status: AC
  Administered 2017-11-11: 30 mg via INTRAVENOUS
  Filled 2017-11-11: qty 1

## 2017-11-11 MED ORDER — IOPAMIDOL (ISOVUE-300) INJECTION 61%
100.0000 mL | Freq: Once | INTRAVENOUS | Status: AC | PRN
  Administered 2017-11-11: 100 mL via INTRAVENOUS

## 2017-11-11 MED ORDER — HYDROMORPHONE HCL 1 MG/ML IJ SOLN
0.5000 mg | Freq: Once | INTRAMUSCULAR | Status: AC
  Administered 2017-11-11: 0.5 mg via INTRAVENOUS
  Filled 2017-11-11: qty 1

## 2017-11-11 MED ORDER — SIMETHICONE 80 MG PO CHEW
80.0000 mg | CHEWABLE_TABLET | Freq: Four times a day (QID) | ORAL | 0 refills | Status: DC | PRN
Start: 2017-11-11 — End: 2017-11-14

## 2017-11-11 MED ORDER — SIMETHICONE 40 MG/0.6ML PO SUSP (UNIT DOSE)
40.0000 mg | Freq: Once | ORAL | Status: AC
  Administered 2017-11-11: 40 mg via ORAL
  Filled 2017-11-11: qty 0.6

## 2017-11-11 NOTE — ED Triage Notes (Signed)
Patient c/o lower right quadrant abdominal pain and distention onset of Thursday. Pt denies any N/V/D. Able to eat just states a lot of pressure in abdomen. Bloated noted. Pt currently on period. Denies urinary issues.

## 2017-11-11 NOTE — ED Provider Notes (Signed)
Emergency Department Provider Note   I have reviewed the triage vital signs and the nursing notes.   HISTORY  Chief Complaint Abdominal Pain   HPI Nicole [name] is a 26 y.o. female without significant past medical history the presents to the emergency department today with 2 days of abdominal pain.  Patient states that when she woke up Friday morning she had severe right upper quadrant pain that has progressively worsened since that time.  She still able to eat but does have a decreased appetite today.  No known fevers.  No nausea or vomiting.  No diarrhea or constipation.  She states that she feels like her abdomen is distended as well.  The pain is worse when she coughs or takes a deep breath is located in her right upper quadrant.  It does not seem to radiate anywhere.  She has not taken any medications for the symptoms.  No history of surgeries.  No history of similar pain.  No urinary symptoms.  No other associated or modifying symptoms.  She is currently on her menstrual cycle.  Past Medical History:  Diagnosis Date  . Depression    hx age 55, fine now  . Hypoglycemia   . Infection    UTI  . Scoliosis   . Syncope     Patient Active Problem List   Diagnosis Date Noted  . Active labor 08/05/2015  . Diffuse abdominal pain 05/02/2015  . Abdominal pain during pregnancy, antepartum   . [redacted] weeks gestation of pregnancy   . [redacted] weeks gestation of pregnancy   . Encounter for fetal anatomic survey   . Hereditary disease in family possibly affecting fetus, affecting management of mother, antepartum condition or complication   . 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 MOUTH SURGERY      Current Outpatient Rx  . Order #: [id number] Class: Print  . Order #: [id number] Class: Print  . Order #: [id number] Class: Print    Allergies Hydrocodone  Family History  Problem Relation Age of Onset  . Cancer Mother 66   breast  . Cancer Maternal Grandmother        lung  . Diabetes Paternal Grandfather   . Otilio Jefferson sequence Son        diagnosed prenatally, delivered and followed postnatally with [facility]    Social History Social History   Tobacco Use  . Smoking status: Former Smoker    Types: Cigarettes    Last attempt to quit: 06/07/2013    Years since quitting: 4.4  . Smokeless tobacco: Never Used  Substance Use Topics  . Alcohol use: No    Comment: occ, prior to preg  . Drug use: No    Review of Systems  All other systems negative except as documented in the HPI. All pertinent positives and negatives as reviewed in the HPI. ____________________________________________   PHYSICAL EXAM:  VITAL SIGNS: ED Triage Vitals  Enc Vitals Group     BP 11/11/17 0740 (!) 124/93     Pulse Rate 11/11/17 0740 (!) 110     Resp 11/11/17 0740 18     Temp 11/11/17 0740 98.2 F (36.8 C)     Temp Source 11/11/17 0740 Oral     SpO2 11/11/17 0740 100 %     Weight 11/11/17 0740 130 lb (59 kg)     Height 11/11/17 0740 5\' 8"  (1.727 m)     Head Circumference --  Peak Flow --      Pain Score 11/11/17 0736 10     Pain Loc --      Pain Edu? --      Excl. in GC? --     Constitutional: Alert and oriented. Well appearing and in no acute distress. Eyes: Conjunctivae are normal. PERRL. EOMI. Head: Atraumatic. Nose: No congestion/rhinnorhea. Mouth/Throat: Mucous membranes are moist.  Oropharynx non-erythematous. Neck: No stridor.  No meningeal signs.   Cardiovascular: Normal rate, regular rhythm. Good peripheral circulation. Grossly normal heart sounds.   Respiratory: Normal respiratory effort.  No retractions. Lungs CTAB. Gastrointestinal: Soft and exquisitely tender in RUQ/RLQ with guarding. No distention.  Musculoskeletal: No lower extremity tenderness nor edema. No gross deformities of extremities. Neurologic:  Normal speech and language. No gross focal neurologic deficits are appreciated.  Skin:   Skin is warm, dry and intact. No rash noted.   ____________________________________________   LABS (all labs ordered are listed, but only abnormal results are displayed)  Labs Reviewed  COMPREHENSIVE METABOLIC PANEL - Abnormal; Notable for the following components:      Result Value   Sodium 134 (*)    Glucose, Bld 117 (*)    AST 14 (*)    ALT 11 (*)    All other components within normal limits  URINALYSIS, ROUTINE W REFLEX MICROSCOPIC - Abnormal; Notable for the following components:   Color, Urine STRAW (*)    Hgb urine dipstick LARGE (*)    Leukocytes, UA SMALL (*)    Bacteria, UA RARE (*)    Squamous Epithelial / LPF 0-5 (*)    All other components within normal limits  URINE CULTURE  CBC WITH DIFFERENTIAL/PLATELET  PREGNANCY, URINE  LIPASE, BLOOD   ____________________________________________   RADIOLOGY  Dg Chest 2 View  Result Date: 11/11/2017 CLINICAL DATA:  26 year old female with a history of right upper quadrant abdominal pain EXAM: CHEST  2 VIEW COMPARISON:  11/04/2009 FINDINGS: The heart size and mediastinal contours are within normal limits. Both lungs are clear. The visualized skeletal structures are unremarkable. IMPRESSION: No radiographic evidence of acute cardiopulmonary disease Electronically Signed   By: Gilmer MorJaime  Wagner D.O.   On: 11/11/2017 08:40   Ct Abdomen Pelvis W Contrast  Result Date: 11/11/2017 CLINICAL DATA:  Acute right lower quadrant abdominal pain. EXAM: CT ABDOMEN AND PELVIS WITH CONTRAST TECHNIQUE: Multidetector CT imaging of the abdomen and pelvis was performed using the standard protocol following bolus administration of intravenous contrast. CONTRAST:  100mL ISOVUE-300 IOPAMIDOL (ISOVUE-300) INJECTION 61% COMPARISON:  CT scan of August 05, 2005. FINDINGS: Lower chest: No acute abnormality. Hepatobiliary: No gallstones are noted. Fatty infiltration of the liver is noted with sparing noted peripherally in right hepatic lobe. Pancreas:  Unremarkable. No pancreatic ductal dilatation or surrounding inflammatory changes. Spleen: Normal in size without focal abnormality. Adrenals/Urinary Tract: Adrenal glands are unremarkable. Kidneys are normal, without renal calculi, focal lesion, or hydronephrosis. Bladder is unremarkable. Stomach/Bowel: Stomach is within normal limits. Appendix appears normal. No evidence of bowel wall thickening, distention, or inflammatory changes. Vascular/Lymphatic: No significant vascular findings are present. No enlarged abdominal or pelvic lymph nodes. Reproductive: Uterus and bilateral adnexa are unremarkable. Other: No abdominal wall hernia or abnormality. No abdominopelvic ascites. Musculoskeletal: No acute or significant osseous findings. IMPRESSION: Fatty infiltration of the liver. No other abnormality seen in the abdomen or pelvis. Electronically Signed   By: Lupita RaiderJames  Green Jr, M.D.   On: 11/11/2017 10:28   [place] Abdomen Limited Ruq  Result Date: 11/11/2017  CLINICAL DATA:  Right upper quadrant pain and abdominal distention since yesterday. EXAM: ULTRASOUND ABDOMEN LIMITED RIGHT UPPER QUADRANT COMPARISON:  None. FINDINGS: Gallbladder: No gallstones or wall thickening visualized. No sonographic Murphy sign noted by sonographer. Common bile duct: Diameter: 0.2 cm. Liver: No focal lesion identified. Within normal limits in parenchymal echogenicity. Portal vein is patent on color Doppler imaging with normal direction of blood flow towards the liver. IMPRESSION: Normal exam. Electronically Signed   By: Drusilla Kannerhomas  Dalessio M.D.   On: 11/11/2017 12:12    ____________________________________________   PROCEDURES  Procedure(s) performed:   Procedures   ____________________________________________   INITIAL IMPRESSION / ASSESSMENT AND PLAN / ED COURSE  Pertinent labs & imaging results that were available during my care of the patient were reviewed by me and considered in my medical decision making (see chart for  details).  89-year-old female with a normal CT scan normal ultrasound and normal labs.  She does have a lot of gas in the right upper quadrant which is where most of her pain is as a possible cause.  Patient's pain relieved on the emergency department.  She was sleeping on multiple re-evaluations without any guarding.  Return here for any new or worsening symptoms.   ____________________________________________  FINAL CLINICAL IMPRESSION(S) / ED DIAGNOSES  Final diagnoses:  RUQ pain  Abdominal pain, unspecified abdominal location    MEDICATIONS GIVEN DURING THIS VISIT:  Medications  iopamidol (ISOVUE-300) 61 % injection (not administered)  sodium chloride 0.9 % bolus 1,000 mL (0 mLs Intravenous Stopped 11/11/17 1120)  HYDROmorphone (DILAUDID) injection 0.5 mg (0.5 mg Intravenous Given 11/11/17 0843)  iopamidol (ISOVUE-300) 61 % injection 100 mL (100 mLs Intravenous Contrast Given 11/11/17 1006)  cefTRIAXone (ROCEPHIN) 1 g in dextrose 5 % 50 mL IVPB (0 g Intravenous Stopped 11/11/17 1215)  ketorolac (TORADOL) 30 MG/ML injection 30 mg (30 mg Intravenous Given 11/11/17 1116)  simethicone (MYLICON) 40 mg/0.726ml suspension 40 mg (40 mg Oral Given 11/11/17 1122)     NEW OUTPATIENT MEDICATIONS STARTED DURING THIS VISIT:  This SmartLink is deprecated. Use AVSMEDLIST instead to display the medication list for a patient.  Note:  This note was prepared with assistance of Dragon voice recognition software. Occasional wrong-word or sound-a-like substitutions may have occurred due to the inherent limitations of voice recognition software.   Marily MemosMesner, Leisha Trinkle, MD 11/11/17 [phone]

## 2017-11-11 NOTE — ED Notes (Signed)
Patient transported to X-ray 

## 2017-11-11 NOTE — ED Notes (Signed)
Patient transported to CT 

## 2017-11-13 ENCOUNTER — Encounter (HOSPITAL_COMMUNITY): Payer: Self-pay | Admitting: Emergency Medicine

## 2017-11-13 ENCOUNTER — Emergency Department (HOSPITAL_COMMUNITY)
Admission: EM | Admit: 2017-11-13 | Discharge: 2017-11-14 | Disposition: A | Discharge status: Home / Self Care | Payer: Self-pay | Attending: Emergency Medicine | Admitting: Emergency Medicine

## 2017-11-13 DIAGNOSIS — N730 Acute parametritis and pelvic cellulitis: Secondary | ICD-10-CM

## 2017-11-13 DIAGNOSIS — Z87891 Personal history of nicotine dependence: Secondary | ICD-10-CM | POA: Insufficient documentation

## 2017-11-13 DIAGNOSIS — R1011 Right upper quadrant pain: Secondary | ICD-10-CM

## 2017-11-13 DIAGNOSIS — R110 Nausea: Secondary | ICD-10-CM | POA: Insufficient documentation

## 2017-11-13 DIAGNOSIS — R1031 Right lower quadrant pain: Secondary | ICD-10-CM

## 2017-11-13 DIAGNOSIS — A599 Trichomoniasis, unspecified: Secondary | ICD-10-CM

## 2017-11-13 LAB — URINALYSIS, ROUTINE W REFLEX MICROSCOPIC
BACTERIA UA: NONE SEEN
BILIRUBIN URINE: NEGATIVE
Glucose, UA: NEGATIVE mg/dL
KETONES UR: NEGATIVE mg/dL
LEUKOCYTES UA: NEGATIVE
NITRITE: NEGATIVE
PROTEIN: NEGATIVE mg/dL
SPECIFIC GRAVITY, URINE: 1.025 (ref 1.005–1.030)
pH: 6 (ref 5.0–8.0)

## 2017-11-13 LAB — COMPREHENSIVE METABOLIC PANEL
ALBUMIN: 4.1 g/dL (ref 3.5–5.0)
ALT: 10 U/L — ABNORMAL LOW (ref 14–54)
ANION GAP: 8 (ref 5–15)
AST: 15 U/L (ref 15–41)
Alkaline Phosphatase: 72 U/L (ref 38–126)
BUN: 14 mg/dL (ref 6–20)
CHLORIDE: 100 mmol/L — AB (ref 101–111)
CO2: 29 mmol/L (ref 22–32)
Calcium: 9.5 mg/dL (ref 8.9–10.3)
Creatinine, Ser: 0.81 mg/dL (ref 0.44–1.00)
GFR calc Af Amer: >60 mL/min (ref >60)
GLUCOSE: 85 mg/dL (ref 65–99)
POTASSIUM: 4 mmol/L (ref 3.5–5.1)
Sodium: 137 mmol/L (ref 135–145)
TOTAL PROTEIN: 7.8 g/dL (ref 6.5–8.1)
Total Bilirubin: 0.4 mg/dL (ref 0.3–1.2)

## 2017-11-13 LAB — I-STAT BETA HCG BLOOD, ED (MC, WL, AP ONLY)

## 2017-11-13 LAB — CBC
HEMATOCRIT: 37.8 % (ref 36.0–46.0)
HEMOGLOBIN: 12.6 g/dL (ref 12.0–15.0)
MCH: 29.9 pg (ref 26.0–34.0)
MCHC: 33.3 g/dL (ref 30.0–36.0)
MCV: 89.6 fL (ref 78.0–100.0)
Platelets: 357 10*3/uL (ref 150–400)
RBC: 4.22 MIL/uL (ref 3.87–5.11)
RDW: 13.3 % (ref 11.5–15.5)
WBC: 6.4 10*3/uL (ref 4.0–10.5)

## 2017-11-13 LAB — URINE CULTURE

## 2017-11-13 LAB — LIPASE, BLOOD: LIPASE: 21 U/L (ref 11–51)

## 2017-11-13 LAB — I-STAT CG4 LACTIC ACID, ED: LACTIC ACID, VENOUS: 0.6 mmol/L (ref 0.5–1.9)

## 2017-11-13 MED ORDER — ONDANSETRON HCL 4 MG/2ML IJ SOLN
4.0000 mg | Freq: Once | INTRAMUSCULAR | Status: AC
  Administered 2017-11-13: 4 mg via INTRAVENOUS
  Filled 2017-11-13: qty 2

## 2017-11-13 MED ORDER — HYDROMORPHONE HCL 1 MG/ML IJ SOLN
1.0000 mg | Freq: Once | INTRAMUSCULAR | Status: AC
  Administered 2017-11-13: 1 mg via INTRAVENOUS
  Filled 2017-11-13: qty 1

## 2017-11-13 NOTE — ED Triage Notes (Signed)
Patient here with complaints of right upper and lower quadrant abdominal pain radiating around to right flank. Reports abdominal distention. States that she has been seen for the same, given antibiotics and pain medication with no relief. Denies n/v/d.

## 2017-11-13 NOTE — ED Provider Notes (Signed)
[facility]-EMERGENCY DEPT Provider Note   CSN: [id number] Arrival date & time: 11/13/17  2147     History   Chief Complaint Chief Complaint  Patient presents with  . Abdominal Pain    HPI Nicole [name] is a 26 y.o. female with a hx of depression, recurrent urinary tract infection, scoliosis presents to the Emergency Department complaining of gradual, persistent, progressively worsening right-sided abdominal pain onset 5 days ago.  Patient presented to the emergency department on 11/11/2017 for the same pain.  At that time her workup included a CT scan, ultrasound of the right upper quadrant and chest x-ray.  There were no acute abnormalities found on this.  She was treated for urinary tract infection with Keflex.  She reports no dysuria at that time and continues to have no dysuria.  She denies a vaginal discharge.  Her last sexual encounter was more than 3 months ago.  Patient states pain is somewhat worse after she eats and nothing seems to make it better.  Patient reports pain is a 10/10, aching and stabbing in nature.  She reports movement and palpation make it significantly worse.  She denies fevers or chills, headache, neck pain, chest pain, vomiting, diarrhea, weakness, dizziness, syncope, dysuria, hematuria.   The history is provided by the patient and medical records. No language interpreter was used.    Past Medical History:  Diagnosis Date  . Depression    hx age 15, fine now  . Hypoglycemia   . Infection    UTI  . Scoliosis   . Syncope     Patient Active Problem List   Diagnosis Date Noted  . Active labor 08/05/2015  . Diffuse abdominal pain 05/02/2015  . Abdominal pain during pregnancy, antepartum   . [redacted] weeks gestation of pregnancy   . [redacted] weeks gestation of pregnancy   . Encounter for fetal anatomic survey   . Hereditary disease in family possibly affecting fetus, affecting management of mother, antepartum condition or complication   .  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 MOUTH SURGERY      OB History    Gravida Para Term Preterm AB Living   2 2 2     2    SAB TAB Ectopic Multiple Live Births         0 2       Home Medications    Prior to Admission medications   Medication Sig Start Date End Date Taking? Authorizing Provider  ibuprofen (ADVIL,MOTRIN) 200 MG tablet Take 400 mg by mouth every 6 (six) hours as needed for fever, headache or moderate pain.   Yes [provider]  doxycycline (VIBRAMYCIN) 100 MG capsule Take 1 capsule (100 mg total) by mouth 2 (two) times daily. 11/14/17   Ossie Yebra, Dahlia Client, PA-C    Family History Family History  Problem Relation Age of Onset  . Cancer Mother 39       breast  . Cancer Maternal Grandmother        lung  . Diabetes Paternal Grandfather   . Otilio Jefferson sequence Son        diagnosed prenatally, delivered and followed postnatally with [facility]    Social History Social History   Tobacco Use  . Smoking status: Former Smoker    Types: Cigarettes    Last attempt to quit: 06/07/2013    Years since quitting: 4.4  . Smokeless tobacco: Never Used  Substance  Use Topics  . Alcohol use: No    Comment: occ, prior to preg  . Drug use: No     Allergies   Hydrocodone   Review of Systems Review of Systems  Constitutional: Negative for appetite change, diaphoresis, fatigue, fever and unexpected weight change.  HENT: Negative for mouth sores.   Eyes: Negative for visual disturbance.  Respiratory: Negative for cough, chest tightness, shortness of breath and wheezing.   Cardiovascular: Negative for chest pain.  Gastrointestinal: Positive for abdominal pain and nausea. Negative for constipation, diarrhea and vomiting.  Endocrine: Negative for polydipsia, polyphagia and polyuria.  Genitourinary: Negative for dysuria, frequency, hematuria and urgency.  Musculoskeletal: Negative for back pain and neck  stiffness.  Skin: Negative for rash.  Allergic/Immunologic: Negative for immunocompromised state.  Neurological: Negative for syncope, light-headedness and headaches.  Hematological: Does not bruise/bleed easily.  Psychiatric/Behavioral: Negative for sleep disturbance. The patient is not nervous/anxious.      Physical Exam Updated Vital Signs BP 104/74 (BP Location: Left Arm)   Pulse 88   Temp 98.2 F (36.8 C) (Oral)   Resp 18   LMP 11/11/2017   SpO2 100%   Physical Exam  Constitutional: She appears well-developed and well-nourished. No distress.  Awake, alert, nontoxic appearance  HENT:  Head: Normocephalic and atraumatic.  Mouth/Throat: Oropharynx is clear and moist. No oropharyngeal exudate.  Eyes: Conjunctivae are normal. No scleral icterus.  Neck: Normal range of motion. Neck supple.  Cardiovascular: Normal rate, regular rhythm, normal heart sounds and intact distal pulses.  No murmur heard. Pulmonary/Chest: Effort normal and breath sounds normal. No respiratory distress. She has no wheezes. She exhibits tenderness ( Right lower ribs).  Equal chest expansion  Abdominal: Soft. Bowel sounds are normal. She exhibits no mass. There is tenderness in the right upper quadrant, right lower quadrant and periumbilical area. There is rebound and guarding. There is no rigidity and no CVA tenderness. Hernia confirmed negative in the right inguinal area and confirmed negative in the left inguinal area.  Genitourinary: Uterus normal. Pelvic exam was performed with patient supine. No labial fusion. There is no rash, tenderness or lesion on the right labia. There is no rash, tenderness or lesion on the left labia. Uterus is not deviated, not enlarged, not fixed and not tender. Cervix exhibits no motion tenderness, no discharge and no friability. Right adnexum displays mass and tenderness. Right adnexum displays no fullness. Left adnexum displays no mass, no tenderness and no fullness. No  erythema, tenderness or bleeding in the vagina. No foreign body in the vagina. No signs of injury around the vagina. Vaginal discharge ( Scant, white) found.  Musculoskeletal: Normal range of motion. She exhibits no edema.  Lymphadenopathy:       Right: No inguinal adenopathy present.       Left: No inguinal adenopathy present.  Neurological: She is alert.  Speech is clear and goal oriented Moves extremities without ataxia  Skin: Skin is warm and dry. She is not diaphoretic. No erythema.  Psychiatric: She has a normal mood and affect.  Nursing note and vitals reviewed.    ED Treatments / Results  Labs (all labs ordered are listed, but only abnormal results are displayed) Labs Reviewed  WET PREP, GENITAL - Abnormal; Notable for the following components:      Result Value   Trich, Wet Prep PRESENT (*)    Clue Cells Wet Prep HPF POC PRESENT (*)    WBC, Wet Prep HPF POC FEW (*)  All other components within normal limits  COMPREHENSIVE METABOLIC PANEL - Abnormal; Notable for the following components:   Chloride 100 (*)    ALT 10 (*)    All other components within normal limits  URINALYSIS, ROUTINE W REFLEX MICROSCOPIC - Abnormal; Notable for the following components:   Hgb urine dipstick SMALL (*)    Squamous Epithelial / LPF 0-5 (*)    All other components within normal limits  LIPASE, BLOOD  CBC  I-STAT BETA HCG BLOOD, ED ([facility], [facility], [facility] ONLY)  I-STAT CG4 LACTIC ACID, ED  GC/CHLAMYDIA PROBE AMP ([place]) NOT AT [facility]    Radiology [place] Transvaginal Non-ob  Result Date: 11/14/2017 CLINICAL DATA:  Right lower quadrant pain EXAM: TRANSABDOMINAL AND TRANSVAGINAL ULTRASOUND OF PELVIS DOPPLER ULTRASOUND OF OVARIES TECHNIQUE: Both transabdominal and transvaginal ultrasound examinations of the pelvis were performed. Transabdominal technique was performed for global imaging of the pelvis including uterus, ovaries, adnexal regions, and pelvic cul-de-sac. It was necessary to proceed with  endovaginal exam following the transabdominal exam to visualize the endometrium and ovaries. Color and duplex Doppler ultrasound was utilized to evaluate blood flow to the ovaries. COMPARISON:  CT 11/11/2017 FINDINGS: Uterus Measurements: 6.7 x 4.8 x 4.7 cm. No fibroids or other mass visualized. Endometrium Thickness: 5.5 mm.  No focal abnormality visualized. Right ovary Measurements: 4.1 x 2.1 x 1.8 cm. Normal appearance/no adnexal mass. Left ovary Measurements: 3.4 x 1.6 x 1.6 cm. Normal appearance/no adnexal mass. Pulsed Doppler evaluation of both ovaries demonstrates normal low-resistance arterial and venous waveforms. Other findings No abnormal free fluid. IMPRESSION: Negative for ovarian torsion or mass.  Negative pelvic ultrasound. Electronically Signed   By: Jasmine PangKim  Fujinaga M.D.   On: 11/14/2017 02:06   [place] Pelvis Complete  Result Date: 11/14/2017 CLINICAL DATA:  Right lower quadrant pain EXAM: TRANSABDOMINAL AND TRANSVAGINAL ULTRASOUND OF PELVIS DOPPLER ULTRASOUND OF OVARIES TECHNIQUE: Both transabdominal and transvaginal ultrasound examinations of the pelvis were performed. Transabdominal technique was performed for global imaging of the pelvis including uterus, ovaries, adnexal regions, and pelvic cul-de-sac. It was necessary to proceed with endovaginal exam following the transabdominal exam to visualize the endometrium and ovaries. Color and duplex Doppler ultrasound was utilized to evaluate blood flow to the ovaries. COMPARISON:  CT 11/11/2017 FINDINGS: Uterus Measurements: 6.7 x 4.8 x 4.7 cm. No fibroids or other mass visualized. Endometrium Thickness: 5.5 mm.  No focal abnormality visualized. Right ovary Measurements: 4.1 x 2.1 x 1.8 cm. Normal appearance/no adnexal mass. Left ovary Measurements: 3.4 x 1.6 x 1.6 cm. Normal appearance/no adnexal mass. Pulsed Doppler evaluation of both ovaries demonstrates normal low-resistance arterial and venous waveforms. Other findings No abnormal free fluid.  IMPRESSION: Negative for ovarian torsion or mass.  Negative pelvic ultrasound. Electronically Signed   By: Jasmine PangKim  Fujinaga M.D.   On: 11/14/2017 02:06   [place] Art/ven Flow Abd Pelv Doppler  Result Date: 11/14/2017 CLINICAL DATA:  Right lower quadrant pain EXAM: TRANSABDOMINAL AND TRANSVAGINAL ULTRASOUND OF PELVIS DOPPLER ULTRASOUND OF OVARIES TECHNIQUE: Both transabdominal and transvaginal ultrasound examinations of the pelvis were performed. Transabdominal technique was performed for global imaging of the pelvis including uterus, ovaries, adnexal regions, and pelvic cul-de-sac. It was necessary to proceed with endovaginal exam following the transabdominal exam to visualize the endometrium and ovaries. Color and duplex Doppler ultrasound was utilized to evaluate blood flow to the ovaries. COMPARISON:  CT 11/11/2017 FINDINGS: Uterus Measurements: 6.7 x 4.8 x 4.7 cm. No fibroids or other mass visualized. Endometrium Thickness: 5.5 mm.  No  focal abnormality visualized. Right ovary Measurements: 4.1 x 2.1 x 1.8 cm. Normal appearance/no adnexal mass. Left ovary Measurements: 3.4 x 1.6 x 1.6 cm. Normal appearance/no adnexal mass. Pulsed Doppler evaluation of both ovaries demonstrates normal low-resistance arterial and venous waveforms. Other findings No abnormal free fluid. IMPRESSION: Negative for ovarian torsion or mass.  Negative pelvic ultrasound. Electronically Signed   By: Jasmine PangKim  Fujinaga M.D.   On: 11/14/2017 02:06    Procedures Procedures (including critical care time)  Medications Ordered in ED Medications  doxycycline (VIBRAMYCIN) 100 mg in dextrose 5 % 250 mL IVPB (100 mg Intravenous New Bag/Given 11/14/17 0215)  HYDROmorphone (DILAUDID) injection 1 mg (1 mg Intravenous Given 11/13/17 2350)  ondansetron (ZOFRAN) injection 4 mg (4 mg Intravenous Given 11/13/17 2350)  cefTRIAXone (ROCEPHIN) 1 g in dextrose 5 % 50 mL IVPB (0 g Intravenous Stopped 11/14/17 0342)  azithromycin (ZITHROMAX) tablet 1,000 mg  (1,000 mg Oral Given 11/14/17 0157)  metroNIDAZOLE (FLAGYL) tablet 2,000 mg (2,000 mg Oral Given 11/14/17 0157)  oxyCODONE-acetaminophen (PERCOCET/ROXICET) 5-325 MG per tablet 2 tablet (2 tablets Oral Given 11/14/17 0226)     Initial Impression / Assessment and Plan / ED Course  I have reviewed the triage vital signs and the nursing notes.  Pertinent labs & imaging results that were available during my care of the patient were reviewed by me and considered in my medical decision making (see chart for details).     Patient presents with right-sided abdominal pain.  Reviewed a CT scan, ultrasound and chest x-ray from last visit without acute abnormality.  Doubt appendicitis with normal white blood cell count.  She is afebrile.  Pain improved with Dilaudid.  This palpable mass in the right adnexa.  Will obtain ultrasound.  Sound without ovarian cyst, ovarian mass or tubo-ovarian abscess.  Pregnancy test negative.  Patient treated for PID here in the emergency department with Rocephin, azithromycin, Flagyl and doxycycline.  She will be discharged home with a prescription for doxycycline.  She is to follow-up with OB/GYN for her pelvic inflammatory disease.  Additionally, she is to follow-up with gastroenterology if her right upper quadrant pain persists.  Previous ultrasound did not show evidence of cholecystitis or cholelithiasis however patient may have gallbladder dysfunction.  Discussed reasons to return immediately to the emergency department for high fevers, worsening symptoms or persistent vomiting.  Patient and mother state understanding and are in agreement with the plan.  The patient was discussed with and seen by Dr. Bebe ShaggyWickline who agrees with the treatment plan.   Final Clinical Impressions(s) / ED Diagnoses   Final diagnoses:  Right upper quadrant abdominal pain  Right lower quadrant abdominal pain  Trichimoniasis  PID (acute pelvic inflammatory disease)    ED Discharge Orders          Ordered    doxycycline (VIBRAMYCIN) 100 MG capsule  2 times daily     11/14/17 0412       Nykolas Bacallao, Dahlia ClientHannah, PA-C 11/14/17 0416    Zadie RhineWickline, Donald, MD 11/14/17 [phone]

## 2017-11-13 NOTE — ED Notes (Signed)
Bed: [facility] Expected date:  Expected time:  Means of arrival:  Comments: 

## 2017-11-14 ENCOUNTER — Emergency Department (HOSPITAL_COMMUNITY): Payer: Self-pay

## 2017-11-14 ENCOUNTER — Telehealth: Payer: Self-pay | Admitting: Emergency Medicine

## 2017-11-14 LAB — WET PREP, GENITAL
Sperm: NONE SEEN
Yeast Wet Prep HPF POC: NONE SEEN

## 2017-11-14 MED ORDER — DOXYCYCLINE HYCLATE 100 MG PO CAPS: 100.0000 mg | ORAL_CAPSULE | Freq: Two times a day (BID) | ORAL | 0 refills | Status: DC

## 2017-11-14 MED ORDER — METRONIDAZOLE 500 MG PO TABS
2000.0000 mg | ORAL_TABLET | Freq: Once | ORAL | Status: AC
  Administered 2017-11-14: 2000 mg via ORAL
  Filled 2017-11-14: qty 4

## 2017-11-14 MED ORDER — DEXTROSE 5 % IV SOLN
1.0000 g | Freq: Once | INTRAVENOUS | Status: AC
  Administered 2017-11-14: 1 g via INTRAVENOUS
  Filled 2017-11-14: qty 10

## 2017-11-14 MED ORDER — DOXYCYCLINE HYCLATE 100 MG IV SOLR
100.0000 mg | Freq: Once | INTRAVENOUS | Status: AC
  Administered 2017-11-14: 100 mg via INTRAVENOUS
  Filled 2017-11-14: qty 100

## 2017-11-14 MED ORDER — AZITHROMYCIN 250 MG PO TABS
1000.0000 mg | ORAL_TABLET | Freq: Once | ORAL | Status: AC
  Administered 2017-11-14: 1000 mg via ORAL
  Filled 2017-11-14: qty 4

## 2017-11-14 MED ORDER — OXYCODONE-ACETAMINOPHEN 5-325 MG PO TABS
2.0000 | ORAL_TABLET | Freq: Once | ORAL | Status: AC
  Administered 2017-11-14: 2 via ORAL
  Filled 2017-11-14: qty 2

## 2017-11-14 NOTE — Discharge Instructions (Signed)
1. Medications: Doxycycline, usual home medications 2. Treatment: rest, drink plenty of fluids, use a condom with every sexual encounter, complete the entire course of your antibiotics 3. Follow Up: Please followup with your primary doctor in 3 days and OB/GYN in 1 week for discussion of your diagnoses and further evaluation after today's visit; if you do not have a primary care doctor use the resource guide provided to find one; Please return to the ER for worsening symptoms, high fevers or persistent vomiting.  You have been tested for chlamydia and gonorrhea.  These results will be available in approximately 3 days.  Please inform all sexual partners if you test positive for any of these diseases.

## 2017-11-14 NOTE — ED Notes (Signed)
Ultrasound is at bedside. Will obtain vital signs when ultrasound finishes.

## 2017-11-14 NOTE — Telephone Encounter (Signed)
Post ED Visit - Positive Culture Follow-up  Culture report reviewed by antimicrobial stewardship pharmacist:  []  Enzo BiNathan Batchelder, Pharm.D. []  Celedonio MiyamotoJeremy Frens, Pharm.D., BCPS AQ-ID []  Garvin FilaMike Maccia, Pharm.D., BCPS []  Georgina PillionElizabeth Martin, [address] BCPS []  WigginsMinh Pham, [address]Ph[address]D., BCPS, AAHIVP []  Estella HuskMichelle Turner, Pharm.D., BCPS, AAHIVP []  Lysle Pearlachel Rumbarger, PharmD, BCPS []  Casilda Carlsaylor Stone, PharmD, BCPS []  Pollyann SamplesAndy Johnston, PharmD, BCPS Dellie Catholiciana Raymond PharmD  Positive urine culture Treated with cephalexin, organism sensitive to the same and no further patient follow-up is required at this time.  Berle MullMiller, Kreig Parson 11/14/2017, 1:17 PM

## 2017-11-14 NOTE — ED Provider Notes (Signed)
Patient seen/examined in the Emergency Department in conjunction with Midlevel Provider  Patient reports continued right upper and right lower abdominal pain for the past several days Exam : Awake alert no distress moderate right upper quadrant and right lower quadrant tenderness  plan: Patient will need pelvic exam and may need further imaging of abdomen   Zadie RhineWickline, Kynnedy Carreno, MD 11/14/17 [id number]

## 2017-11-15 LAB — GC/CHLAMYDIA PROBE AMP (~~LOC~~) NOT AT ARMC
Chlamydia: POSITIVE — AB
Neisseria Gonorrhea: NEGATIVE

## 2018-01-21 ENCOUNTER — Other Ambulatory Visit: Payer: Self-pay

## 2018-01-21 ENCOUNTER — Encounter (HOSPITAL_BASED_OUTPATIENT_CLINIC_OR_DEPARTMENT_OTHER): Payer: Self-pay | Admitting: Emergency Medicine

## 2018-01-21 ENCOUNTER — Emergency Department (HOSPITAL_BASED_OUTPATIENT_CLINIC_OR_DEPARTMENT_OTHER)
Admission: EM | Admit: 2018-01-21 | Discharge: 2018-01-21 | Disposition: A | Discharge status: Home / Self Care | Payer: Self-pay | Attending: Emergency Medicine | Admitting: Emergency Medicine

## 2018-01-21 DIAGNOSIS — M130 Polyarthritis, unspecified: Secondary | ICD-10-CM | POA: Insufficient documentation

## 2018-01-21 DIAGNOSIS — Z87891 Personal history of nicotine dependence: Secondary | ICD-10-CM | POA: Insufficient documentation

## 2018-01-21 DIAGNOSIS — R220 Localized swelling, mass and lump, head: Secondary | ICD-10-CM | POA: Insufficient documentation

## 2018-01-21 DIAGNOSIS — F1210 Cannabis abuse, uncomplicated: Secondary | ICD-10-CM | POA: Insufficient documentation

## 2018-01-21 HISTORY — DX: Cardiac arrhythmia, unspecified: I49.9

## 2018-01-21 LAB — URINALYSIS, MICROSCOPIC (REFLEX)

## 2018-01-21 LAB — PREGNANCY, URINE: Preg Test, Ur: NEGATIVE

## 2018-01-21 LAB — COMPREHENSIVE METABOLIC PANEL
ALBUMIN: 3.4 g/dL — AB (ref 3.5–5.0)
ALT: 11 U/L — ABNORMAL LOW (ref 14–54)
AST: 14 U/L — ABNORMAL LOW (ref 15–41)
Alkaline Phosphatase: 73 U/L (ref 38–126)
BILIRUBIN TOTAL: 0.4 mg/dL (ref 0.3–1.2)
BUN: 23 mg/dL — AB (ref 6–20)
Creatinine, Ser: 0.92 mg/dL (ref 0.44–1.00)
Total Protein: 8.2 g/dL — ABNORMAL HIGH (ref 6.5–8.1)

## 2018-01-21 LAB — URINALYSIS, ROUTINE W REFLEX MICROSCOPIC
Glucose, UA: NEGATIVE mg/dL
Ketones, ur: NEGATIVE mg/dL
LEUKOCYTES UA: NEGATIVE
Nitrite: NEGATIVE
PH: 6 (ref 5.0–8.0)
Protein, ur: 30 mg/dL — AB
Specific Gravity, Urine: >1.03 — ABNORMAL HIGH (ref 1.005–1.030)

## 2018-01-21 LAB — I-STAT CHEM 8, ED
BUN: 21 mg/dL — AB (ref 6–20)
Calcium, Ion: 1.23 mmol/L (ref 1.15–1.40)
Chloride: 101 mmol/L (ref 101–111)
Creatinine, Ser: 0.8 mg/dL (ref 0.44–1.00)
Glucose, Bld: 123 mg/dL — ABNORMAL HIGH (ref 65–99)
HEMATOCRIT: 39 % (ref 36.0–46.0)
HEMOGLOBIN: 13.3 g/dL (ref 12.0–15.0)
POTASSIUM: 3.4 mmol/L — AB (ref 3.5–5.1)
SODIUM: 140 mmol/L (ref 135–145)
TCO2: 28 mmol/L (ref 22–32)

## 2018-01-21 LAB — CBC
HCT: 37.8 % (ref 36.0–46.0)
HEMOGLOBIN: 12.6 g/dL (ref 12.0–15.0)
MCH: 29.3 pg (ref 26.0–34.0)
MCHC: 33.3 g/dL (ref 30.0–36.0)
MCV: 87.9 fL (ref 78.0–100.0)
Platelets: 513 10*3/uL — ABNORMAL HIGH (ref 150–400)
RBC: 4.3 MIL/uL (ref 3.87–5.11)
RDW: 14.2 % (ref 11.5–15.5)
WBC: 14.6 10*3/uL — ABNORMAL HIGH (ref 4.0–10.5)

## 2018-01-21 LAB — C-REACTIVE PROTEIN: CRP: 8.7 mg/dL — AB (ref <1.0)

## 2018-01-21 LAB — SEDIMENTATION RATE: SED RATE: 107 mm/h — AB (ref 0–22)

## 2018-01-21 MED ORDER — KETOROLAC TROMETHAMINE 15 MG/ML IJ SOLN
15.0000 mg | Freq: Once | INTRAMUSCULAR | Status: AC
  Administered 2018-01-21: 15 mg via INTRAVENOUS
  Filled 2018-01-21: qty 1

## 2018-01-21 MED ORDER — SODIUM CHLORIDE 0.9 % IV BOLUS (SEPSIS)
1000.0000 mL | Freq: Once | INTRAVENOUS | Status: AC
  Administered 2018-01-21: 1000 mL via INTRAVENOUS

## 2018-01-21 MED ORDER — OXYCODONE-ACETAMINOPHEN 5-325 MG PO TABS
1.0000 | ORAL_TABLET | Freq: Once | ORAL | Status: AC
  Administered 2018-01-21: 1 via ORAL
  Filled 2018-01-21: qty 1

## 2018-01-21 NOTE — ED Provider Notes (Signed)
[facility] EMERGENCY DEPARTMENT Provider Note   CSN: [id number] Arrival date & time: 01/21/18  [id number]     History   Chief Complaint Chief Complaint  Patient presents with  . Follow-up    HPI Nicole [name] is a 27 y.o. female.  HPI Patient is a 27 year old female presents the emergency department 2 weeks of migratory joint type pain.  She reports pain in her right TMJ as well as pain around her left first metacarpal.  She also reports pain near her lateral right ankle.  She was seen at an outside hospital approximately 1 week ago for similar symptoms and at that time also had a fever to 102.5.  She is worked up extensively with labs, EKG, chest x-ray without significant abnormality.  She is currently without a primary care physician.  She was placed on prednisone at that time and reports ongoing discomfort and pain.  She feels like her jaw pain is worsening.  She states it is painful for her to close her jaw.  She feels like the right side of her face might be slightly swollen.  No fever today.  No medication today.  No longer on steroids.  Otherwise healthy without significant medical problems.  Patient reports use of cannabis but denies IV drug abuse.     Past Medical History:  Diagnosis Date  . Depression    hx age 27, fine now  . Hypoglycemia   . Infection    UTI  . Irregular heart rate   . Scoliosis   . Syncope     Patient Active Problem List   Diagnosis Date Noted  . Active labor 08/05/2015  . Diffuse abdominal pain 05/02/2015  . Abdominal pain during pregnancy, antepartum   . [redacted] weeks gestation of pregnancy   . [redacted] weeks gestation of pregnancy   . Encounter for fetal anatomic survey   . Hereditary disease in family possibly affecting fetus, affecting management of mother, antepartum condition or complication   . 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 MOUTH SURGERY      OB History    Gravida  Para Term Preterm AB Living   2 2 2     2    SAB TAB Ectopic Multiple Live Births         0 2       Home Medications    Prior to Admission medications   Medication Sig Start Date End Date Taking? Authorizing Provider  ibuprofen (ADVIL,MOTRIN) 200 MG tablet Take 400 mg by mouth every 6 (six) hours as needed for fever, headache or moderate pain.    [provider]    Family History Family History  Problem Relation Age of Onset  . Cancer Mother 2552       breast  . Cancer Maternal Grandmother        lung  . Diabetes Paternal Grandfather   . Otilio Jeffersonierre Robin sequence Son        diagn[facility] prenatally, [facility]    Social History Social History   Tobacco Use  . Smoking status: Former Smoker    Types: Cigarettes    Last attempt to quit: 06/07/2013    Years since quitting: 4.6  . Smokeless tobacco: Never Used  Substance Use Topics  . Alcohol use: No    Comment: occ, prior to preg  . Drug use: No     Allergies   Hydrocodone  Review of Systems Review of Systems  All other systems reviewed and are negative.    Physical Exam Updated Vital Signs BP 111/73   Pulse (!) 104   Temp (!) 97.5 F (36.4 C) (Oral)   Ht 5\' 8"  (1.727 m)   Wt 56.7 kg (125 lb)   LMP 01/18/2018   SpO2 100%   BMI 19.01 kg/m   Physical Exam  Constitutional: She is oriented to person, place, and time. She appears well-developed and well-nourished.  HENT:  Head: Normocephalic.  Mild tenderness overlying the right TMJ without erythema or significant swelling.  Dentition both upper and lower on the right side is normal.  Tolerating secretions.  Uvula is midline.  Anterior neck is normal and supple.  No lymphadenopathy noted.  Eyes: EOM are normal.  Neck: Normal range of motion. Neck supple. No tracheal deviation present. No thyromegaly present.  Cardiovascular: Normal rate and regular rhythm.  Pulmonary/Chest: Effort normal and breath sounds normal.    Abdominal: Soft. She exhibits no distension. There is no tenderness.  Musculoskeletal: Normal range of motion.  Mild erythema and swelling of the left first MCP joint.  Mild painful range of motion.  Full range of motion of the left wrist.  Normal radial pulses bilaterally.  Mild tenderness of the lateral malleolus on the right foot with discoloration and mild swelling.  Lymphadenopathy:    She has no cervical adenopathy.  Neurological: She is alert and oriented to person, place, and time.  Psychiatric: She has a normal mood and affect.  Nursing note and vitals reviewed.    ED Treatments / Results  Labs (all labs ordered are listed, but only abnormal results are displayed) Labs Reviewed  CBC - Abnormal; Notable for the following components:      Result Value   WBC 14.6 (*)    Platelets 513 (*)    All other components within normal limits  COMPREHENSIVE METABOLIC PANEL - Abnormal; Notable for the following components:   BUN 23 (*)    Total Protein 8.2 (*)    Albumin 3.4 (*)    AST 14 (*)    ALT 11 (*)    All other components within normal limits  SEDIMENTATION RATE - Abnormal; Notable for the following components:   Sed Rate 107 (*)    All other components within normal limits  URINALYSIS, ROUTINE W REFLEX MICROSCOPIC - Abnormal; Notable for the following components:   APPearance CLOUDY (*)    Specific Gravity, Urine >1.030 (*)    Hgb urine dipstick TRACE (*)    Bilirubin Urine SMALL (*)    Protein, ur 30 (*)    All other components within normal limits  URINALYSIS, MICROSCOPIC (REFLEX) - Abnormal; Notable for the following components:   Bacteria, UA MANY (*)    Squamous Epithelial / LPF 6-30 (*)    All other components within normal limits  I-STAT CHEM 8, ED - Abnormal; Notable for the following components:   Potassium 3.4 (*)    BUN 21 (*)    Glucose, Bld 123 (*)    All other components within normal limits  CULTURE, BLOOD (ROUTINE X 2)  CULTURE, BLOOD (ROUTINE X 2)   PREGNANCY, URINE  C-REACTIVE PROTEIN    EKG  EKG Interpretation None       Radiology No results found.  Procedures Procedures (including critical care time)  Medications Ordered in ED Medications  oxyCODONE-acetaminophen (PERCOCET/ROXICET) 5-325 MG per tablet 1 tablet (1 tablet Oral Given 01/21/18 0913)  sodium  chloride 0.9 % bolus 1,000 mL (1,000 mLs Intravenous New Bag/Given 01/21/18 0913)  ketorolac (TORADOL) 15 MG/ML injection 15 mg (15 mg Intravenous Given 01/21/18 0913)     Initial Impression / Assessment and Plan / ED Course  I have reviewed the triage vital signs and the nursing notes.  Pertinent labs & imaging results that were available during my care of the patient were reviewed by me and considered in my medical decision making (see chart for details).     Repeat basic workup in the emergency department without significant abnormalities.  Patient is overall well-appearing.  She will need a referral to a rheumatologist.  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  She will need a primary care physician and then referral to a rheumatologist.  Referral to [facility] center and then hopefully referral to a rheumatologist after that.  Overall well-appearing.  Stable for discharge.  No indication for additional workup in the emergency department.  Final Clinical Impressions(s) / ED Diagnoses   Final diagnoses:  Polyarthritis of multiple sites    ED Discharge Orders    None       Azalia Bilis, MD 01/21/18 1051

## 2018-01-21 NOTE — ED Triage Notes (Signed)
Pt states she has been having right sided facial swelling, swelling bilaterally in hands and feet and painful joints for over two weeks.  No dental problems.  Intermittent fevers at home.  Pt went to [facility] for same on 1/28 but unsure of what is going on.  Pt wanting to be rechecked for same symptoms.

## 2018-01-26 ENCOUNTER — Inpatient Hospital Stay (HOSPITAL_COMMUNITY)
Admission: EM | Admit: 2018-01-26 | Discharge: 2018-01-29 | DRG: 554 | Disposition: A | Discharge status: Home / Self Care | Payer: Self-pay | Attending: Family Medicine | Admitting: Family Medicine

## 2018-01-26 ENCOUNTER — Encounter (HOSPITAL_COMMUNITY): Payer: Self-pay

## 2018-01-26 ENCOUNTER — Other Ambulatory Visit: Payer: Self-pay

## 2018-01-26 ENCOUNTER — Emergency Department (HOSPITAL_COMMUNITY): Payer: Self-pay

## 2018-01-26 DIAGNOSIS — D72829 Elevated white blood cell count, unspecified: Secondary | ICD-10-CM

## 2018-01-26 DIAGNOSIS — F1910 Other psychoactive substance abuse, uncomplicated: Secondary | ICD-10-CM | POA: Diagnosis present

## 2018-01-26 DIAGNOSIS — M419 Scoliosis, unspecified: Secondary | ICD-10-CM | POA: Diagnosis present

## 2018-01-26 DIAGNOSIS — R252 Cramp and spasm: Secondary | ICD-10-CM

## 2018-01-26 DIAGNOSIS — Z803 Family history of malignant neoplasm of breast: Secondary | ICD-10-CM

## 2018-01-26 DIAGNOSIS — R6884 Jaw pain: Secondary | ICD-10-CM

## 2018-01-26 DIAGNOSIS — Z833 Family history of diabetes mellitus: Secondary | ICD-10-CM

## 2018-01-26 DIAGNOSIS — M1990 Unspecified osteoarthritis, unspecified site: Secondary | ICD-10-CM | POA: Diagnosis present

## 2018-01-26 DIAGNOSIS — M064 Inflammatory polyarthropathy: Principal | ICD-10-CM | POA: Diagnosis present

## 2018-01-26 DIAGNOSIS — M109 Gout, unspecified: Secondary | ICD-10-CM | POA: Diagnosis present

## 2018-01-26 DIAGNOSIS — Z87891 Personal history of nicotine dependence: Secondary | ICD-10-CM

## 2018-01-26 DIAGNOSIS — Z8279 Family history of other congenital malformations, deformations and chromosomal abnormalities: Secondary | ICD-10-CM

## 2018-01-26 DIAGNOSIS — Z885 Allergy status to narcotic agent status: Secondary | ICD-10-CM

## 2018-01-26 DIAGNOSIS — M26629 Arthralgia of temporomandibular joint, unspecified side: Secondary | ICD-10-CM

## 2018-01-26 DIAGNOSIS — M25532 Pain in left wrist: Secondary | ICD-10-CM

## 2018-01-26 DIAGNOSIS — Z801 Family history of malignant neoplasm of trachea, bronchus and lung: Secondary | ICD-10-CM

## 2018-01-26 LAB — I-STAT BETA HCG BLOOD, ED (MC, WL, AP ONLY)

## 2018-01-26 LAB — CULTURE, BLOOD (ROUTINE X 2)
CULTURE: NO GROWTH
CULTURE: NO GROWTH
Special Requests: ADEQUATE
Special Requests: ADEQUATE

## 2018-01-26 LAB — TSH: TSH: 2.796 u[IU]/mL (ref 0.350–4.500)

## 2018-01-26 LAB — URINALYSIS, ROUTINE W REFLEX MICROSCOPIC
BILIRUBIN URINE: NEGATIVE
Glucose, UA: NEGATIVE mg/dL
Hgb urine dipstick: NEGATIVE
Ketones, ur: NEGATIVE mg/dL
LEUKOCYTES UA: NEGATIVE
NITRITE: NEGATIVE
Protein, ur: NEGATIVE mg/dL
SPECIFIC GRAVITY, URINE: 1.024 (ref 1.005–1.030)
pH: 5 (ref 5.0–8.0)

## 2018-01-26 LAB — CBC
HCT: 40.9 % (ref 36.0–46.0)
HCT: 33.5 % — ABNORMAL LOW (ref 36.0–46.0)
HEMOGLOBIN: 13.7 g/dL (ref 12.0–15.0)
Hemoglobin: 10.8 g/dL — ABNORMAL LOW (ref 12.0–15.0)
MCH: 29.6 pg (ref 26.0–34.0)
MCH: 28.3 pg (ref 26.0–34.0)
MCHC: 33.5 g/dL (ref 30.0–36.0)
MCHC: 32.2 g/dL (ref 30.0–36.0)
MCV: 88.3 fL (ref 78.0–100.0)
MCV: 87.9 fL (ref 78.0–100.0)
PLATELETS: 531 10*3/uL — AB (ref 150–400)
Platelets: 705 10*3/uL — ABNORMAL HIGH (ref 150–400)
RBC: 4.63 MIL/uL (ref 3.87–5.11)
RBC: 3.81 MIL/uL — AB (ref 3.87–5.11)
RDW: 14.2 % (ref 11.5–15.5)
RDW: 14.3 % (ref 11.5–15.5)
WBC: 22.4 10*3/uL — AB (ref 4.0–10.5)
WBC: 10.8 10*3/uL — AB (ref 4.0–10.5)

## 2018-01-26 LAB — LIPASE, BLOOD: Lipase: 30 U/L (ref 11–51)

## 2018-01-26 LAB — RAPID URINE DRUG SCREEN, HOSP PERFORMED
AMPHETAMINES: POSITIVE — AB
BARBITURATES: NOT DETECTED
BENZODIAZEPINES: NOT DETECTED
Cocaine: NOT DETECTED
Opiates: POSITIVE — AB
TETRAHYDROCANNABINOL: POSITIVE — AB

## 2018-01-26 LAB — CBG MONITORING, ED: Glucose-Capillary: 94 mg/dL (ref 65–99)

## 2018-01-26 LAB — COMPREHENSIVE METABOLIC PANEL
ALK PHOS: 82 U/L (ref 38–126)
ALT: 17 U/L (ref 14–54)
ANION GAP: 12 (ref 5–15)
AST: 21 U/L (ref 15–41)
Albumin: 3.3 g/dL — ABNORMAL LOW (ref 3.5–5.0)
BILIRUBIN TOTAL: 0.5 mg/dL (ref 0.3–1.2)
BUN: 16 mg/dL (ref 6–20)
CO2: 21 mmol/L — ABNORMAL LOW (ref 22–32)
Calcium: 9.3 mg/dL (ref 8.9–10.3)
Chloride: 101 mmol/L (ref 101–111)
Creatinine, Ser: 0.82 mg/dL (ref 0.44–1.00)
Glucose, Bld: 117 mg/dL — ABNORMAL HIGH (ref 65–99)
Potassium: 5.1 mmol/L (ref 3.5–5.1)
Sodium: 134 mmol/L — ABNORMAL LOW (ref 135–145)
TOTAL PROTEIN: 8.2 g/dL — AB (ref 6.5–8.1)

## 2018-01-26 LAB — C-REACTIVE PROTEIN: CRP: 5.9 mg/dL — AB (ref <1.0)

## 2018-01-26 LAB — SEDIMENTATION RATE: Sed Rate: 68 mm/hr — ABNORMAL HIGH (ref 0–22)

## 2018-01-26 LAB — CREATININE, SERUM
CREATININE: 0.84 mg/dL (ref 0.44–1.00)
GFR calc Af Amer: >60 mL/min (ref >60)
GFR calc non Af Amer: >60 mL/min (ref >60)

## 2018-01-26 MED ORDER — ONDANSETRON HCL 4 MG PO TABS: 4.0000 mg | ORAL_TABLET | Freq: Four times a day (QID) | ORAL | Status: DC | PRN

## 2018-01-26 MED ORDER — LIDOCAINE HCL (PF) 1 % IJ SOLN
INTRAMUSCULAR | Status: AC
  Filled 2018-01-26: qty 30

## 2018-01-26 MED ORDER — MORPHINE SULFATE (PF) 4 MG/ML IV SOLN
4.0000 mg | Freq: Once | INTRAVENOUS | Status: AC
  Administered 2018-01-26: 4 mg via INTRAVENOUS
  Filled 2018-01-26: qty 1

## 2018-01-26 MED ORDER — DEXTROSE 5 % IV SOLN
100.0000 mg | Freq: Two times a day (BID) | INTRAVENOUS | Status: DC
  Administered 2018-01-26 – 2018-01-28 (×4): 100 mg via INTRAVENOUS
  Filled 2018-01-26 (×4): qty 100

## 2018-01-26 MED ORDER — ACETAMINOPHEN 325 MG PO TABS
650.0000 mg | ORAL_TABLET | Freq: Four times a day (QID) | ORAL | Status: DC | PRN
  Administered 2018-01-26: 650 mg via ORAL
  Filled 2018-01-26: qty 2

## 2018-01-26 MED ORDER — ALUM & MAG HYDROXIDE-SIMETH 200-200-20 MG/5ML PO SUSP
30.0000 mL | ORAL | Status: DC | PRN
  Administered 2018-01-26: 30 mL via ORAL
  Filled 2018-01-26: qty 30

## 2018-01-26 MED ORDER — ONDANSETRON HCL 4 MG/2ML IJ SOLN
4.0000 mg | Freq: Four times a day (QID) | INTRAMUSCULAR | Status: DC | PRN
  Administered 2018-01-26 – 2018-01-27 (×3): 4 mg via INTRAVENOUS
  Filled 2018-01-26 (×4): qty 2

## 2018-01-26 MED ORDER — DEXTROSE 5 % IV SOLN
2.0000 g | INTRAVENOUS | Status: DC
  Administered 2018-01-26 – 2018-01-27 (×2): 2 g via INTRAVENOUS
  Filled 2018-01-26 (×2): qty 20

## 2018-01-26 MED ORDER — ACETAMINOPHEN 650 MG RE SUPP: 650.0000 mg | Freq: Four times a day (QID) | RECTAL | Status: DC | PRN

## 2018-01-26 MED ORDER — MORPHINE SULFATE (PF) 4 MG/ML IV SOLN
2.0000 mg | INTRAVENOUS | Status: DC | PRN
  Administered 2018-01-26 – 2018-01-28 (×10): 2 mg via INTRAVENOUS
  Filled 2018-01-26 (×10): qty 1

## 2018-01-26 MED ORDER — SODIUM CHLORIDE 0.9 % IV BOLUS (SEPSIS)
1000.0000 mL | Freq: Once | INTRAVENOUS | Status: AC
  Administered 2018-01-26: 1000 mL via INTRAVENOUS

## 2018-01-26 MED ORDER — ONDANSETRON 4 MG PO TBDP
4.0000 mg | ORAL_TABLET | Freq: Once | ORAL | Status: AC | PRN
  Administered 2018-01-26: 4 mg via ORAL
  Filled 2018-01-26: qty 1

## 2018-01-26 MED ORDER — ENOXAPARIN SODIUM 40 MG/0.4ML ~~LOC~~ SOLN
40.0000 mg | SUBCUTANEOUS | Status: DC
  Administered 2018-01-26: 40 mg via SUBCUTANEOUS
  Filled 2018-01-26 (×2): qty 0.4

## 2018-01-26 NOTE — ED Triage Notes (Signed)
Pt presents with 2 week h/o bilateral joint pain and swelling.  Pt reports swelling began in legs and is now to L hand and R side of jaw.  Pt reports 2 day h/o umbilical pain with n/v/d.

## 2018-01-26 NOTE — ED Notes (Signed)
Pt's CBG result was 94. Informed Millie - RN.

## 2018-01-26 NOTE — ED Notes (Signed)
Admitting PA questioned about aspirate orders. Will advise me further.

## 2018-01-26 NOTE — ED Notes (Signed)
Millie, RN called this RN to inform that aspirate had not been collected and Millie, RN was advised to disregard orders. This RN called 5N nurse to inform her of this message.

## 2018-01-26 NOTE — ED Provider Notes (Signed)
[facility] Provider Note   CSN: [id number] Arrival date & time: 01/26/18  1111     History   Chief Complaint No chief complaint on file.   HPI Nicole [name] is a 27 y.o. female.  The history is provided by the patient.  Wrist Pain  This is a new problem. The current episode started more than 1 week ago. The problem occurs constantly. The problem has been rapidly worsening. Pertinent negatives include no chest pain, no abdominal pain, no headaches and no shortness of breath. The symptoms are aggravated by twisting. Nothing relieves the symptoms. She has tried nothing for the symptoms. The treatment provided no relief.    27 year old female with history of active substance abuse with crack cocaine, no IVDA, presents with 3 weeks of arthralgias.  It started in bilateral ankles and feet which have since improved but now is associated with her left wrist and right-sided jaw.  She states she has had multiple ED visits and on review of the records I can see that she was [facility].  She has been intermittently febrile.  She did get placed on 10 days of prednisone which did not seem to help.  Her main complaint today is severe left wrist pain 10 out of 10 associated with movement and stabbing in nature.  She has been told she needs to see a primary care doctor or rheumatologist but she has no insurance had been unable to follow-up with anybody yet.   Past Medical History:  Diagnosis Date  . Depression    hx age 27, fine now  . Hypoglycemia   . Infection    UTI  . Irregular heart rate   . Scoliosis   . Syncope     Patient Active Problem List   Diagnosis Date Noted  . Active labor 08/05/2015  . Diffuse abdominal pain 05/02/2015  . Abdominal pain during pregnancy, antepartum   . [redacted] weeks gestation of pregnancy   . [redacted] weeks gestation of pregnancy   . Encounter for fetal anatomic survey   . Hereditary disease in family  possibly affecting fetus, affecting management of mother, antepartum condition or complication   . 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 MOUTH SURGERY      OB History    Gravida Para Term Preterm AB Living   2 2 2     2    SAB TAB Ectopic Multiple Live Births         0 2       Home Medications    Prior to Admission medications   Medication Sig Start Date End Date Taking? Authorizing Provider  ibuprofen (ADVIL,MOTRIN) 200 MG tablet Take 400 mg by mouth every 6 (six) hours as needed for fever, headache or moderate pain.    [provider]    Family History Family History  Problem Relation Age of Onset  . Cancer Mother 5452       breast  . Cancer Maternal Grandmother        lung  . Diabetes Paternal Grandfather   . Otilio Jeffersonierre Robin sequence Son        diagnosed prenatally, delivered [facility]    Social History Social History   Tobacco Use  . Smoking status: Former Smoker    Types: Cigarettes    Last attempt to quit: 06/07/2013    Years  since quitting: 4.6  . Smokeless tobacco: Never Used  Substance Use Topics  . Alcohol use: No    Comment: occ, prior to preg  . Drug use: No     Allergies   Hydrocodone   Review of Systems Review of Systems  Constitutional: Negative for chills and fever.  HENT: Negative for ear pain, sore throat and voice change.   Eyes: Negative for pain and visual disturbance.  Respiratory: Negative for cough and shortness of breath.   Cardiovascular: Negative for chest pain and palpitations.  Gastrointestinal: Negative for abdominal pain and vomiting.  Genitourinary: Negative for dysuria, hematuria, vaginal bleeding and vaginal discharge.  Musculoskeletal: Positive for arthralgias and joint swelling. Negative for back pain, myalgias and neck pain.  Skin: Negative for color change and rash.  Neurological: Negative for seizures, syncope and headaches.    All other systems reviewed and are negative.    Physical Exam Updated Vital Signs BP 107/68   Pulse (!) 143   Temp 98.1 F (36.7 C) (Oral)   Resp (!) 24   Ht 5\' 9"  (1.753 m)   Wt 56.7 kg (125 lb)   LMP 01/18/2018   SpO2 98%   BMI 18.46 kg/m   Physical Exam  Constitutional: She appears well-developed and well-nourished. No distress.  HENT:  Head: Normocephalic and atraumatic.    Eyes: Conjunctivae are normal.  Neck: Neck supple.  Cardiovascular: Regular rhythm. Tachycardia present.  No murmur heard. Pulmonary/Chest: Effort normal and breath sounds normal. No respiratory distress.  Abdominal: Soft. There is no tenderness.  Musculoskeletal: She exhibits no edema.       Right shoulder: Normal.       Left shoulder: Normal.       Right elbow: Normal.      Left elbow: Normal.       Right wrist: Normal.       Left wrist: She exhibits decreased range of motion, tenderness, bony tenderness and swelling. She exhibits no crepitus.       Right knee: Normal.       Left knee: Normal.       Right ankle: Normal.       Left ankle: Normal.       Arms: Neurological: She is alert.  Skin: Skin is warm and dry.  Psychiatric: She has a normal mood and affect.  Nursing note and vitals reviewed.      ED Treatments / Results  Labs (all labs ordered are listed, but only abnormal results are displayed) Labs Reviewed  COMPREHENSIVE METABOLIC PANEL - Abnormal; Notable for the following components:      Result Value   Sodium 134 (*)    CO2 21 (*)    Glucose, Bld 117 (*)    Total Protein 8.2 (*)    Albumin 3.3 (*)    All other components within normal limits  CBC - Abnormal; Notable for the following components:   WBC 22.4 (*)    Platelets 705 (*)    All other components within normal limits  CULTURE, BLOOD (ROUTINE X 2)  CULTURE, BLOOD (ROUTINE X 2)  LIPASE, BLOOD  TSH  URINALYSIS, ROUTINE W REFLEX MICROSCOPIC  SEDIMENTATION RATE  C-REACTIVE PROTEIN  RHEUMATOID FACTOR  B.  BURGDORFI ANTIBODIES  I-STAT BETA HCG BLOOD, ED ([facility], [facility], [facility] ONLY)    EKG  EKG Interpretation None       Radiology Dg Wrist Complete Left  Result Date: 01/26/2018 CLINICAL DATA:  Severe left wrist pain for 1 week. No  known injury. Initial encounter. EXAM: LEFT WRIST - COMPLETE 3+ VIEW COMPARISON:  10/26/2013 FINDINGS: There is no evidence of fracture or dislocation. There is no evidence of arthropathy or other focal bone abnormality. Soft tissues are unremarkable. IMPRESSION: Negative. Electronically Signed   By: Harmon Pier M.D.   On: 01/26/2018 13:08    Procedures Procedures (including critical care time)  Medications Ordered in ED Medications  sodium chloride 0.9 % bolus 1,000 mL (not administered)  morphine 4 MG/ML injection 4 mg (not administered)  ondansetron (ZOFRAN-ODT) disintegrating tablet 4 mg (4 mg Oral Given 01/26/18 1125)     Initial Impression / Assessment and Plan / ED Course  I have reviewed the triage vital signs and the nursing notes.  Pertinent labs & imaging results that were available during my care of the patient were reviewed by me and considered in my medical decision making (see chart for details).  Clinical Course as of Jan 27 1805  Sat Jan 26, 2018  1458 Discussed with Dr. Izora Ribas hand surgery who will evaluate the patient in the ER for possible joint aspiration.  [MB]  1508 Patient updated on the results of her lab work and her x-rays.  She understands that the hand surgeon will evaluate her in the department and potentially will do a joint aspiration for further workup of her left wrist problem.  [MB]  1620 Patient was seen by hand surgery Dr. Izora Ribas who aspirated some fluid from the snuffbox area of her wrist where the biggest swelling was.  He got minimal amount of some bloody fluid was being sent off to the lab.  As the patient has had multiple ED visits does not have a primary care doctor has elevated inflammatory markers I feel the patient should be  admitted for further workup of this.  [MB]  1646 Discussed with the admitting hospitalist accepts the patient for admission for further workup of possible infectious causes of the patient's symptoms.  [MB]    Clinical Course User Index [MB] Terrilee Files, MD     Final Clinical Impressions(s) / ED Diagnoses   Final diagnoses:  Arthralgia of left wrist  Leukocytosis, unspecified type    ED Discharge Orders    None       Terrilee Files, MD 01/27/18 1807

## 2018-01-26 NOTE — Consult Note (Signed)
Reason for Consult:wrist pain Referring Physician: ER  CC:My wrist hurts  HPI:  Nicole [name] is an 27 y.o. left handed female who presents with pain in left wrist.  Pt has a history going back  ~3wks; initially bilateral ankles hurt, presented to outside ER ? Rheumatoid arthritis, given prednisone, no antibiotics; ankles became better, then pain moved to both wrists, went back to outside ER and states that no meds prescribed.  Right wrist is better, but now has pain left wrist and pain right side of face     .   Pain is rated at   10 /10 and is described as sharp.  Pain is constant.  Pain is made better by rest/immobilization, worse with motion.   Pt denies IVDA, denies recent infections, however abd pain back in Nov - CT/US negative.  Associated signs/symptoms: see above Previous treatment:    Past Medical History:  Diagnosis Date  . Depression    hx age 57, fine now  . Hypoglycemia   . Infection    UTI  . Irregular heart rate   . Scoliosis   . Syncope     Past Surgical History:  Procedure Laterality Date  . MOUTH SURGERY      Family History  Problem Relation Age of Onset  . Cancer Mother 65       breast  . Cancer Maternal Grandmother        lung  . Diabetes Paternal Grandfather   . Polo Riley sequence Son        diagnosed prenatally, delivered and followed postnatally with [facility]    Social History:  reports that she quit smoking about 4 years ago. Her smoking use included cigarettes. she has never used smokeless tobacco. She reports that she does not drink alcohol or use drugs.  Allergies:  Allergies  Allergen Reactions  . Hydrocodone Itching    Medications: I have reviewed the patient's current medications.  Results for orders placed or performed during the hospital encounter of 01/26/18 (from the past 48 hour(s))  Lipase, blood     Status: None   Collection Time: 01/26/18 11:24 AM  Result Value Ref Range   Lipase 30 11 - 51 U/L    Comment: Performed at [facility], [address] [place], [place] [address]  Comprehensive metabolic panel     Status: Abnormal   Collection Time: 01/26/18 11:24 AM  Result Value Ref Range   Sodium 134 (L) 135 - 145 mmol/L   Potassium 5.1 3.5 - 5.1 mmol/L   Chloride 101 101 - 111 mmol/L   CO2 21 (L) 22 - 32 mmol/L   Glucose, Bld 117 (H) 65 - 99 mg/dL   BUN 16 6 - 20 mg/dL   Creatinine, Ser 0.82 0.44 - 1.00 mg/dL   Calcium 9.3 8.9 - 10.3 mg/dL   Total Protein 8.2 (H) 6.5 - 8.1 g/dL   Albumin 3.3 (L) 3.5 - 5.0 g/dL   AST 21 15 - 41 U/L   ALT 17 14 - 54 U/L   Alkaline Phosphatase 82 38 - 126 U/L   Total Bilirubin 0.5 0.3 - 1.2 mg/dL   GFR calc non Af Amer >60 >60 mL/min   GFR calc Af Amer >60 >60 mL/min    Comment: (NOTE) The eGFR has been calculated using the CKD EPI equation. This calculation has not been validated in all clinical situations. eGFR's persistently <60 mL/min signify possible Chronic Kidney Disease.    Anion gap 12  5 - 15    Comment: Performed at [facility], [place] [address], [place], [place] [address]  CBC     Status: Abnormal   Collection Time: 01/26/18 11:24 AM  Result Value Ref Range   WBC 22.4 (H) 4.0 - 10.5 K/uL   RBC 4.63 3.87 - 5.11 MIL/uL   Hemoglobin 13.7 12.0 - 15.0 g/dL   HCT 40.9 36.0 - 46.0 %   MCV 88.3 78.0 - 100.0 fL   MCH 29.6 26.0 - 34.0 pg   MCHC 33.5 30.0 - 36.0 g/dL   RDW 14.2 11.5 - 15.5 %   Platelets 705 (H) 150 - 400 K/uL    Comment: Performed at [facility], [place] [address], [place], [place] [address]  TSH     Status: None   Collection Time: 01/26/18 11:24 AM  Result Value Ref Range   TSH 2.796 0.350 - 4.500 uIU/mL    Comment: Performed by a 3rd Generation assay with a functional sensitivity of <=0.01 uIU/mL. Performed at [facility], [place] [address] [place], [place] [address]   I-Stat beta hCG blood, ED     Status: None   Collection Time: 01/26/18 11:32 AM  Result Value Ref Range   I-stat hCG, quantitative <5.0 <5 mIU/mL    Comment 3            Comment:   GEST. AGE      CONC.  (mIU/mL)   <=1 WEEK        5 - 50     2 WEEKS       50 - 500     3 WEEKS       100 - 10,000     4 WEEKS     1,000 - 30,000        FEMALE AND NON-PREGNANT FEMALE:     LESS THAN 5 mIU/mL   Sedimentation rate     Status: Abnormal   Collection Time: 01/26/18 12:45 PM  Result Value Ref Range   Sed Rate 68 (H) 0 - 22 mm/hr    Comment: Performed at [facility] [address], [place], [place] [address]  C-reactive protein     Status: Abnormal   Collection Time: 01/26/18 12:45 PM  Result Value Ref Range   CRP 5.9 (H) <1.0 mg/dL    Comment: Performed at [facility] [address], [place], [place] [address]  Urinalysis, Routine w reflex microscopic     Status: Abnormal   Collection Time: 01/26/18  1:21 PM  Result Value Ref Range   Color, Urine YELLOW YELLOW   APPearance HAZY (A) CLEAR   Specific Gravity, Urine 1.024 1.005 - 1.030   pH 5.0 5.0 - 8.0   Glucose, UA NEGATIVE NEGATIVE mg/dL   Hgb urine dipstick NEGATIVE NEGATIVE   Bilirubin Urine NEGATIVE NEGATIVE   Ketones, ur NEGATIVE NEGATIVE mg/dL   Protein, ur NEGATIVE NEGATIVE mg/dL   Nitrite NEGATIVE NEGATIVE   Leukocytes, UA NEGATIVE NEGATIVE    Comment: Performed at [facility] [address], [place], [place] [address]    Dg Wrist Complete Left  Result Date: 01/26/2018 CLINICAL DATA:  Severe left wrist pain for 1 week. No known injury. Initial encounter. EXAM: LEFT WRIST - COMPLETE 3+ VIEW COMPARISON:  10/26/2013 FINDINGS: There is no evidence of fracture or dislocation. There is no evidence of arthropathy or other focal bone abnormality. Soft tissues are unremarkable. IMPRESSION: Negative. Electronically Signed  By: Margarette Canada M.D.   On: 01/26/2018 13:08    Pertinent items are noted in HPI. Temp:  [98.1 F (36.7 C)] 98.1 F (36.7 C) (02/09 1118) Pulse Rate:  [109-143] 121 (02/09 1530) Resp:  [16-24] 16 (02/09 1445) BP: (102-110)/(67-88) 107/72  (02/09 1530) SpO2:  [98 %-100 %] 99 % (02/09 1530) Weight:  [56.7 kg (125 lb)] 56.7 kg (125 lb) (02/09 1207) General appearance: alert and cooperative Resp: clear to auscultation bilaterally Cardio: regular rate and rhythm Extremities: bilateral ankles with no pian with motion, no erythema, right wrist no pain with motion, no erythema; left wrist with pain with wrist motion, some erythema along dorsolateral wrist border, not very tender on ulnar side of wrist of mid wrist joint as would expect with septic wrist, more tender over 1st dorsal compartment, some swelling in this area   Assessment: Left wrist pain and swelling - don't think this is septic wrist Plan: Left radial wrist aspirated - no pus, no appreciable fluid Would w/u for inflammatory arthropathies  Resting splint for left wrist for comfort ? MRI wrist   Elisabeth Strom C Berlyn Malina 01/26/2018, 3:58 PM

## 2018-01-26 NOTE — ED Notes (Signed)
Advised b y PA to disrequard aspirate orders.

## 2018-01-26 NOTE — ED Notes (Signed)
MD at bedside. 

## 2018-01-26 NOTE — H&P (Signed)
History and Physical    Nicole [name] CBU:[id number] DOB: [birth year] DOA: 01/26/2018  PCP: Patient, No Pcp Per  Patient coming from: Home - lives with ; NOK:   Chief Complaint: severe left rist p[ain and swelling  HPI: Nicole [name] is a 27 y.o. female with medical history significant of active substance abuse with crack cocaine, no IVDA, presents with c/o  of arthralgias 3 week duration.  It started in both ankles and feet which have since improved but now is associated with her left wrist and right-sided jaw.   She states she has had multiple ED visits and the Epic records reflect her last visit was to [facility] on 4 February.  Patient reports intermittent fevers. She did get placed on 10 days of prednisone which she thinks did not seem to help.  Her main complaint today is severe left wrist pain 10 out of 10 associated with movement and stabbing in nature.  She has been told she needs to see a primary care doctor or rheumatologist but she has no insurance and had been unable to follow-up with anybody yet.  ED Course: on presentation patient was afebrile, tachycardic with HR 109-143, but her BP remained normal and she maintained normal SpO2 Her blood work showed elevated WBC 22,400, thrombocytosis 705,000 CRP was elevated 5.9 and ESR 68 Left wrist Xray was negative for acute findings  Review of Systems: As per HPI; otherwise review of systems reviewed and negative.   Ambulatory Status: Ambulates without assistance  Past Medical History:  Diagnosis Date  . Depression    hx age 59, fine now  . Hypoglycemia   . Infection    UTI  . Irregular heart rate   . Scoliosis   . Syncope     Past Surgical History:  Procedure Laterality Date  . MOUTH SURGERY      Social History   Socioeconomic History  . Marital status: Legally Separated    Spouse name: Not on file  . Number of children: Not on file  . Years of education: Not on file  . Highest education level: Not on  file  Social Needs  . Financial resource strain: Not on file  . Food insecurity - worry: Not on file  . Food insecurity - inability: Not on file  . Transportation needs - medical: Not on file  . Transportation needs - non-medical: Not on file  Occupational History  . Not on file  Tobacco Use  . Smoking status: Former Smoker    Types: Cigarettes    Last attempt to quit: 06/07/2013    Years since quitting: 4.6  . Smokeless tobacco: Never Used  Substance and Sexual Activity  . Alcohol use: No    Comment: occ, prior to preg  . Drug use: No  . Sexual activity: Yes    Comment: Last sexual encounter 2 months ago, used condom  Other Topics Concern  . Not on file  Social History Narrative  . Not on file    Allergies  Allergen Reactions  . Hydrocodone Itching    Family History  Problem Relation Age of Onset  . Cancer Mother 27       breast  . Cancer Maternal Grandmother        lung  . Diabetes Paternal Grandfather   . Polo Riley sequence Son        diagnosed prenatally, delivered and followed postnatally with [facility]    Prior to Admission medications   Medication Sig Start Date  End Date Taking? Authorizing Provider  ibuprofen (ADVIL,MOTRIN) 200 MG tablet Take 200-400 mg by mouth every 6 (six) hours as needed for fever or headache (pain).    Yes [provider]    Physical Exam: Vitals:   01/26/18 1730 01/26/18 1745 01/26/18 1800 01/26/18 1815  BP: 112/68 111/68 110/70 111/69  Pulse: (!) 111 (!) 113 (!) 114 (!) 112  Resp:   16   Temp:      TempSrc:      SpO2: 95% 96% 94% 97%  Weight:      Height:         General:  Appears calm, but uncomfortable due to pain Eyes: PERRL, normal lids, irises & conjunctiva ENT: grossly normal hearing, lips & tongue Neck: no LAD, masses or thyromegaly Cardiovascular: RRR, no m/r/g. No LE edema. Respiratory: CTA bilaterally, no w/r/r. Normal respiratory effort. Abdomen: soft, protuberant, nontender nondistended, bowel sounds  positive x4  Skin: no rash or induration seen on limited exam Musculoskeletal: grossly normal tone BUE/BLE, left wrist swelling and limited range of motion Psychiatric: Depressed mood and affect, speech fluent and appropriate Neurologic: grossly non-focal.        Radiological Exams on Admission: Dg Wrist Complete Left  Result Date: 01/26/2018 CLINICAL DATA:  Severe left wrist pain for 1 week. No known injury. Initial encounter. EXAM: LEFT WRIST - COMPLETE 3+ VIEW COMPARISON:  10/26/2013 FINDINGS: There is no evidence of fracture or dislocation. There is no evidence of arthropathy or other focal bone abnormality. Soft tissues are unremarkable. IMPRESSION: Negative. Electronically Signed   By: Margarette Canada M.D.   On: 01/26/2018 13:08    EKG: Independently reviewed.  NSR with rate  Labs on Admission: I have personally reviewed the available labs and imaging studies at the time of the admission.   Assessment/Plan Principal Problem:   Arthritis Active Problems:   Polysubstance abuse (HCC)   Arthritis - etiology unclear, rheumatoid versus reactive After the patient initiated prednisone therapy from 30 markers improved sed rate was 100-7 and today is 68, PCR improved from 8.7-5.9 Leukocytosis of 22,000 could be attributed to prednisone therapy We will check chlamydia urinary antigen to exclude reactive arthritis Due to her jaw pain will assign soft regular diet Recheck CBC tomorrow  Substance abuse - patient uses crack cocaine and smokes a pack of cigarettes daily She has not been drinking for a long time Will check UDS She refused NicoDerm at the present time and said with ask for that if starts having cravings   DVT prophylaxis: Lovenox or SCDs Code Status: Full - confirmed with patient/family Family Communication: Mother-at bedside Disposition Plan: To be determined  Consults called: Rheumatology at [facility] Admission status: Inpatient   York Grice  PA-C [phone] [facility]  If note is complete, please contact covering daytime or nighttime physician. [email or web address] Password [facility]  01/26/2018, 6:49 PM

## 2018-01-26 NOTE — ED Notes (Signed)
Gave pt Malawiturkey sandwich, applesauce & Coke Cola, per Dr. Charm BargesButler.

## 2018-01-26 NOTE — ED Notes (Signed)
Attempted to call report

## 2018-01-27 ENCOUNTER — Inpatient Hospital Stay (HOSPITAL_COMMUNITY): Payer: Self-pay

## 2018-01-27 DIAGNOSIS — F1910 Other psychoactive substance abuse, uncomplicated: Secondary | ICD-10-CM

## 2018-01-27 DIAGNOSIS — M26621 Arthralgia of right temporomandibular joint: Secondary | ICD-10-CM

## 2018-01-27 DIAGNOSIS — D72829 Elevated white blood cell count, unspecified: Secondary | ICD-10-CM

## 2018-01-27 DIAGNOSIS — M25532 Pain in left wrist: Secondary | ICD-10-CM

## 2018-01-27 LAB — CBC
HCT: 35.1 % — ABNORMAL LOW (ref 36.0–46.0)
HEMOGLOBIN: 11.4 g/dL — AB (ref 12.0–15.0)
MCH: 28.5 pg (ref 26.0–34.0)
MCHC: 32.5 g/dL (ref 30.0–36.0)
MCV: 87.8 fL (ref 78.0–100.0)
Platelets: 542 10*3/uL — ABNORMAL HIGH (ref 150–400)
RBC: 4 MIL/uL (ref 3.87–5.11)
RDW: 14.1 % (ref 11.5–15.5)
WBC: 8.5 10*3/uL (ref 4.0–10.5)

## 2018-01-27 LAB — HIV ANTIBODY (ROUTINE TESTING W REFLEX): HIV SCREEN 4TH GENERATION: NONREACTIVE

## 2018-01-27 LAB — BASIC METABOLIC PANEL
Anion gap: 12 (ref 5–15)
BUN: 8 mg/dL (ref 6–20)
CO2: 23 mmol/L (ref 22–32)
CREATININE: 0.74 mg/dL (ref 0.44–1.00)
Calcium: 8.8 mg/dL — ABNORMAL LOW (ref 8.9–10.3)
Chloride: 101 mmol/L (ref 101–111)
GFR calc Af Amer: >60 mL/min (ref >60)
GFR calc non Af Amer: >60 mL/min (ref >60)
Glucose, Bld: 108 mg/dL — ABNORMAL HIGH (ref 65–99)
Potassium: 4 mmol/L (ref 3.5–5.1)
SODIUM: 136 mmol/L (ref 135–145)

## 2018-01-27 LAB — RHEUMATOID FACTOR: RHEUMATOID FACTOR: 13 [IU]/mL (ref 0.0–13.9)

## 2018-01-27 MED ORDER — KETOROLAC TROMETHAMINE 30 MG/ML IJ SOLN
30.0000 mg | Freq: Four times a day (QID) | INTRAMUSCULAR | Status: DC | PRN
  Administered 2018-01-28: 30 mg via INTRAVENOUS
  Filled 2018-01-27: qty 1

## 2018-01-27 MED ORDER — COLCHICINE 0.6 MG PO TABS
1.2000 mg | ORAL_TABLET | Freq: Once | ORAL | Status: AC
  Administered 2018-01-27: 1.2 mg via ORAL
  Filled 2018-01-27: qty 2

## 2018-01-27 MED ORDER — SODIUM CHLORIDE 0.9 % IV BOLUS (SEPSIS)
1000.0000 mL | Freq: Once | INTRAVENOUS | Status: AC
  Administered 2018-01-27: 1000 mL via INTRAVENOUS

## 2018-01-27 MED ORDER — METHYLPREDNISOLONE SODIUM SUCC 125 MG IJ SOLR
80.0000 mg | Freq: Once | INTRAMUSCULAR | Status: AC
  Administered 2018-01-27: 80 mg via INTRAVENOUS
  Filled 2018-01-27: qty 2

## 2018-01-27 NOTE — Progress Notes (Signed)
[facility] PROGRESS NOTE  Alfredo BachWhitney Hornig WUJ:[id number] DOB: 1991-07-12 DOA: 01/26/2018 PCP: Patient, No Pcp Per  Assessment/Plan:  Inflammatory arthritis Cont abx On exam seems like a gout attack that is polyarticular will do trial of Colcrys Toradol prn One time dose of steroids STI labs sent off -she admits to unprotected sex with large number of partners, denies VD or dyspareunia Try to convince to have vaginal exam tomorrow Jaw img ordered  Low BP Bolus ordered  Substance abuse Cessation advised  HPI/Subjective: Feel ill. LLQ pain remains.  Objective: Vitals:   01/27/18 0532 01/27/18 1435  BP: 110/64 (!) 95/56  Pulse: 90 83  Resp: 16 16  Temp: 98.6 F (37 C) 98 F (36.7 C)  SpO2: 98% 100%    Intake/Output Summary (Last 24 hours) at 01/27/2018 1802 Last data filed at 01/27/2018 1500 Gross per 24 hour  Intake 550 ml  Output -  Net 550 ml   Filed Weights   01/26/18 1207  Weight: 56.7 kg (125 lb)    Exam:   General:  NAD, ill appearing, tenesmus  Cardiovascular: tachy, RR, no MRG  Respiratory: CTAB, nl wob  Abdomen: BS+, ND, NT  Musculoskeletal: wrist swelling, moving all extr  Data Reviewed: Basic Metabolic Panel: Recent Labs  Lab 01/21/18 0856 01/21/18 0929 01/26/18 1124 01/26/18 2021 01/27/18 0925  NA  --  140 134*  --  136  K  --  3.4* 5.1  --  4.0  CL  --  101 101  --  101  CO2  --   --  21*  --  23  GLUCOSE  --  123* 117*  --  108*  BUN 23* 21* 16  --  8  CREATININE 0.92 0.80 0.82 0.84 0.74  CALCIUM  --   --  9.3  --  8.8*   Liver Function Tests: Recent Labs  Lab 01/21/18 0856 01/26/18 1124  AST 14* 21  ALT 11* 17  ALKPHOS 73 82  BILITOT 0.4 0.5  PROT 8.2* 8.2*  ALBUMIN 3.4* 3.3*   Recent Labs  Lab 01/26/18 1124  LIPASE 30   No results for input(s): AMMONIA in the last 168 hours. CBC: Recent Labs  Lab 01/21/18 0856 01/21/18 0929 01/26/18 1124 01/26/18 2021 01/27/18 0925  WBC 14.6*  --  22.4* 10.8* 8.5   HGB 12.6 13.3 13.7 10.8* 11.4*  HCT 37.8 39.0 40.9 33.5* 35.1*  MCV 87.9  --  88.3 87.9 87.8  PLT 513*  --  705* 531* 542*   Cardiac Enzymes: No results for input(s): CKTOTAL, CKMB, CKMBINDEX, TROPONINI in the last 168 hours. BNP (last 3 results) No results for input(s): BNP in the last 8760 hours.  ProBNP (last 3 results) No results for input(s): PROBNP in the last 8760 hours.  CBG: Recent Labs  Lab 01/26/18 1603  GLUCAP 94    Recent Results (from the past 240 hour(s))  Blood culture (routine x 2)     Status: None   Collection Time: 01/21/18  9:10 AM  Result Value Ref Range Status   Specimen Description   Final    BLOOD RIGHT ANTECUBITAL Performed at [facility], [address] [place], [place] [address]    Special Requests   Final    BOTTLES DRAWN AEROBIC AND ANAEROBIC Blood Culture adequate volume Performed at [facility], [address] [place], [place] [address]    Culture   Final    NO GROWTH 5 DAYS  Performed at [facility], [address], [place], [place] [address]    Report Status 01/26/2018 FINAL  Final  Blood culture (routine x 2)     Status: None   Collection Time: 01/21/18  9:45 AM  Result Value Ref Range Status   Specimen Description   Final    BLOOD BLOOD LEFT ARM Performed at [facility], [address] [place], [place] [address]    Special Requests   Final    BOTTLES DRAWN AEROBIC AND ANAEROBIC Blood Culture adequate volume Performed at [facility], [address] [place], [place] [address]    Culture   Final    NO GROWTH 5 DAYS Performed at [facility], [address] [place], [place] [address]    Report Status 01/26/2018 FINAL  Final  Culture, blood (routine x 2)     Status: None (Preliminary result)   Collection Time: 01/26/18 12:40 PM  Result Value Ref Range Status   Specimen Description BLOOD RIGHT ANTECUBITAL  Final   Special Requests   Final    BOTTLES DRAWN AEROBIC  AND ANAEROBIC Blood Culture adequate volume   Culture   Final    NO GROWTH < 24 HOURS Performed at [facility], [address] [place], [place] [address]    Report Status PENDING  Incomplete  Culture, blood (routine x 2)     Status: None (Preliminary result)   Collection Time: 01/26/18  1:05 PM  Result Value Ref Range Status   Specimen Description BLOOD RIGHT HAND  Final   Special Requests IN PEDIATRIC BOTTLE Blood Culture adequate volume  Final   Culture   Final    NO GROWTH < 24 HOURS Performed at [facility], [address], [place], [place] [address]    Report Status PENDING  Incomplete     Studies: Dg Wrist Complete Left  Result Date: 01/26/2018 CLINICAL DATA:  Severe left wrist pain for 1 week. No known injury. Initial encounter. EXAM: LEFT WRIST - COMPLETE 3+ VIEW COMPARISON:  10/26/2013 FINDINGS: There is no evidence of fracture or dislocation. There is no evidence of arthropathy or other focal bone abnormality. Soft tissues are unremarkable. IMPRESSION: Negative. Electronically Signed   By: Harmon Pier M.D.   On: 01/26/2018 13:08    Scheduled Meds: . colchicine  1.2 mg Oral Once  . enoxaparin (LOVENOX) injection  40 mg Subcutaneous Q24H  . methylPREDNISolone (SOLU-MEDROL) injection  80 mg Intravenous Once   Continuous Infusions: . cefTRIAXone (ROCEPHIN)  IV Stopped (01/26/18 2234)  . doxycycline (VIBRAMYCIN) IV Stopped (01/27/18 1016)    Principal Problem:   Arthritis Active Problems:   Polysubstance abuse (HCC)    Time spent: AMION    Haydee Salter  [facility] Pager AMION. If 7PM-7AM, please contact night-coverage at [email or web address], password [facility] 01/27/2018, 6:02 PM  LOS: 1 day

## 2018-01-27 NOTE — Progress Notes (Signed)
Orthopedic Tech Progress Note Patient Details:  Nicole BachWhitney Flynt April 03, 1991 [id number]  Ortho Devices Type of Ortho Device: Wrist splint Ortho Device/Splint Interventions: Application   Post Interventions Patient Tolerated: Well Instructions Provided: Care of device   Saul FordyceJennifer C Solace Wendorff 01/27/2018, 8:42 AM

## 2018-01-28 ENCOUNTER — Inpatient Hospital Stay (HOSPITAL_COMMUNITY): Payer: Self-pay

## 2018-01-28 DIAGNOSIS — R6884 Jaw pain: Secondary | ICD-10-CM

## 2018-01-28 DIAGNOSIS — M1990 Unspecified osteoarthritis, unspecified site: Secondary | ICD-10-CM

## 2018-01-28 LAB — CBC WITH DIFFERENTIAL/PLATELET
Basophils Absolute: 0 10*3/uL (ref 0.0–0.1)
Basophils Relative: 0 %
Eosinophils Absolute: 0 10*3/uL (ref 0.0–0.7)
Eosinophils Relative: 0 %
HEMATOCRIT: 33 % — AB (ref 36.0–46.0)
Hemoglobin: 10.7 g/dL — ABNORMAL LOW (ref 12.0–15.0)
LYMPHS PCT: 14 %
Lymphs Abs: 1.1 10*3/uL (ref 0.7–4.0)
MCH: 28.2 pg (ref 26.0–34.0)
MCHC: 32.4 g/dL (ref 30.0–36.0)
MCV: 86.8 fL (ref 78.0–100.0)
MONO ABS: 0.4 10*3/uL (ref 0.1–1.0)
MONOS PCT: 5 %
NEUTROS ABS: 6.2 10*3/uL (ref 1.7–7.7)
Neutrophils Relative %: 81 %
Platelets: 533 10*3/uL — ABNORMAL HIGH (ref 150–400)
RBC: 3.8 MIL/uL — ABNORMAL LOW (ref 3.87–5.11)
RDW: 13.8 % (ref 11.5–15.5)
WBC: 7.7 10*3/uL (ref 4.0–10.5)

## 2018-01-28 LAB — GC/CHLAMYDIA PROBE AMP (~~LOC~~) NOT AT ARMC
Chlamydia: NEGATIVE
NEISSERIA GONORRHEA: NEGATIVE

## 2018-01-28 MED ORDER — CEFTRIAXONE SODIUM 2 G IJ SOLR
2.0000 g | INTRAMUSCULAR | Status: DC
  Administered 2018-01-28: 2 g via INTRAVENOUS
  Filled 2018-01-28: qty 20

## 2018-01-28 MED ORDER — OXYCODONE-ACETAMINOPHEN 5-325 MG PO TABS
1.0000 | ORAL_TABLET | Freq: Four times a day (QID) | ORAL | Status: DC | PRN
  Administered 2018-01-28 – 2018-01-29 (×2): 1 via ORAL
  Filled 2018-01-28 (×2): qty 1

## 2018-01-28 MED ORDER — SODIUM CHLORIDE 0.9 % IV SOLN: 100.0000 mg | Freq: Two times a day (BID) | INTRAVENOUS | Status: DC

## 2018-01-28 MED ORDER — METHYLPREDNISOLONE SODIUM SUCC 125 MG IJ SOLR
60.0000 mg | Freq: Four times a day (QID) | INTRAMUSCULAR | Status: DC
  Administered 2018-01-28 – 2018-01-29 (×4): 60 mg via INTRAVENOUS
  Filled 2018-01-28 (×5): qty 2

## 2018-01-28 MED ORDER — SODIUM CHLORIDE 0.9 % IV SOLN
100.0000 mg | Freq: Two times a day (BID) | INTRAVENOUS | Status: DC
  Administered 2018-01-28 – 2018-01-29 (×2): 100 mg via INTRAVENOUS
  Filled 2018-01-28 (×3): qty 100

## 2018-01-28 MED ORDER — OXYCODONE-ACETAMINOPHEN 5-325 MG PO TABS
1.0000 | ORAL_TABLET | Freq: Once | ORAL | Status: AC
  Administered 2018-01-28: 1 via ORAL
  Filled 2018-01-28: qty 1

## 2018-01-28 MED ORDER — OXYCODONE-ACETAMINOPHEN 5-325 MG PO TABS
2.0000 | ORAL_TABLET | Freq: Once | ORAL | Status: AC
  Administered 2018-01-29: 2 via ORAL
  Filled 2018-01-28: qty 2

## 2018-01-28 NOTE — Progress Notes (Signed)
Paged md and sent AMION text requesting oral pain meds. No reply from MD.

## 2018-01-28 NOTE — Progress Notes (Signed)
Pt requesting oral pain meds.  Does not like to take Morphine and the IV toradol is not helping. Notified MD of request for PO pain medications.

## 2018-01-28 NOTE — Progress Notes (Signed)
[facility] PROGRESS NOTE  Nicole [name] ZOX:[id number] DOB: May 26, 1991 DOA: 01/26/2018 PCP: Patient, No Pcp Per  Assessment/Plan:  Inflammatory arthritis Cont abx On exam seems like a gout attack that is polyarticular will do trial of Colcrys Toradol prn Sch steroids STI labs sent off -she admits to unprotected sex with large number of partners, denies VD or dyspareunia Jaw img ordered-->neg MRI wrist ordered Per Ortho they felt was inflammatory not infection from septic joint Called lab and aspirate is missing  Low BP Better, drinking well  Substance abuse Cessation advised  HPI/Subjective: C/o wrist pain. Denies having abd pain before or currently.  Objective: Vitals:   01/27/18 2235 01/28/18 0641  BP: (!) 96/46 115/62  Pulse: 83 81  Resp: 16 16  Temp: 98.3 F (36.8 C) 98.6 F (37 C)  SpO2: 99% 100%    Intake/Output Summary (Last 24 hours) at 01/28/2018 1300 Last data filed at 01/27/2018 2230 Gross per 24 hour  Intake 840 ml  Output -  Net 840 ml   Filed Weights   01/26/18 1207  Weight: 56.7 kg (125 lb)    Exam:   General:  NAD, ill appearing, increased ROM of jaw  Cardiovascular: tachy, RR, no MRG  Respiratory: CTAB, nl wob  Abdomen: BS+, ND, NT  Musculoskeletal: wrist swelling, moving all extr  Data Reviewed: Basic Metabolic Panel: Recent Labs  Lab 01/26/18 1124 01/26/18 2021 01/27/18 0925  NA 134*  --  136  K 5.1  --  4.0  CL 101  --  101  CO2 21*  --  23  GLUCOSE 117*  --  108*  BUN 16  --  8  CREATININE 0.82 0.84 0.74  CALCIUM 9.3  --  8.8*   Liver Function Tests: Recent Labs  Lab 01/26/18 1124  AST 21  ALT 17  ALKPHOS 82  BILITOT 0.5  PROT 8.2*  ALBUMIN 3.3*   Recent Labs  Lab 01/26/18 1124  LIPASE 30   No results for input(s): AMMONIA in the last 168 hours. CBC: Recent Labs  Lab 01/26/18 1124 01/26/18 2021 01/27/18 0925 01/28/18 0611  WBC 22.4* 10.8* 8.5 7.7  NEUTROABS  --   --   --  6.2  HGB 13.7  10.8* 11.4* 10.7*  HCT 40.9 33.5* 35.1* 33.0*  MCV 88.3 87.9 87.8 86.8  PLT 705* 531* 542* 533*   Cardiac Enzymes: No results for input(s): CKTOTAL, CKMB, CKMBINDEX, TROPONINI in the last 168 hours. BNP (last 3 results) No results for input(s): BNP in the last 8760 hours.  ProBNP (last 3 results) No results for input(s): PROBNP in the last 8760 hours.  CBG: Recent Labs  Lab 01/26/18 1603  GLUCAP 94    Recent Results (from the past 240 hour(s))  Blood culture (routine x 2)     Status: None   Collection Time: 01/21/18  9:10 AM  Result Value Ref Range Status   Specimen Description   Final    BLOOD RIGHT ANTECUBITAL Performed at [facility], [address] [place], [place] [address]    Special Requests   Final    BOTTLES DRAWN AEROBIC AND ANAEROBIC Blood Culture adequate volume Performed at [facility], [address], [place], [place] [address]    Culture   Final    NO GROWTH 5 DAYS Performed at [facility], [address], [place], [place] [address]    Report Status 01/26/2018 FINAL  Final  Blood culture (routine x  2)     Status: None   Collection Time: 01/21/18  9:45 AM  Result Value Ref Range Status   Specimen Description   Final    BLOOD BLOOD LEFT ARM Performed at [facility] [place], [address] [place], [place] [address]    Special Requests   Final    BOTTLES DRAWN AEROBIC AND ANAEROBIC Blood Culture adequate volume Performed at [facility] [place] [facility], [address] [place], [place] [address]    Culture   Final    NO GROWTH 5 DAYS Performed at [facility], [address] [place], [place] [address]    Report Status 01/26/2018 FINAL  Final  Culture, blood (routine x 2)     Status: None (Preliminary result)   Collection Time: 01/26/18 12:40 PM  Result Value Ref Range Status   Specimen Description BLOOD RIGHT ANTECUBITAL  Final   Special Requests   Final    BOTTLES DRAWN AEROBIC AND ANAEROBIC Blood Culture  adequate volume   Culture   Final    NO GROWTH 2 DAYS Performed at [facility], [address] [place], [place] [address]    Report Status PENDING  Incomplete  Culture, blood (routine x 2)     Status: None (Preliminary result)   Collection Time: 01/26/18  1:05 PM  Result Value Ref Range Status   Specimen Description BLOOD RIGHT HAND  Final   Special Requests IN PEDIATRIC BOTTLE Blood Culture adequate volume  Final   Culture   Final    NO GROWTH 2 DAYS Performed at [facility], [address] [place], [place] [address]    Report Status PENDING  Incomplete     Studies: Dg Orthopantogram  Result Date: 01/27/2018 CLINICAL DATA:  Pt reports pain in right jaw (non TMJ) x 3 weeks with no known injury. States that pain/swelling started in feet and moved up both legs and body until now residing in left wrist and right jaw. No hx of this pain in pt. EXAM: ORTHOPANTOGRAM/PANORAMIC COMPARISON:  CT maxillofacial, 10/26/2013. FINDINGS: No fracture.  No bone lesion. There are multiple fillings, but no evidence of an un treated carie. No periapical lucency is seen to suggest a root abscess. The temporomandibular joints are normally aligned. IMPRESSION: Negative exam. Electronically Signed   By: Amie Portlandavid  Ormond M.D.   On: 01/27/2018 21:42    Scheduled Meds: . enoxaparin (LOVENOX) injection  40 mg Subcutaneous Q24H  . methylPREDNISolone (SOLU-MEDROL) injection  60 mg Intravenous Q6H   Continuous Infusions: . cefTRIAXone (ROCEPHIN)  IV    . doxycycline (VIBRAMYCIN) IV      Principal Problem:   Arthritis Active Problems:   Polysubstance abuse (HCC)    Time spent: AMION    Haydee SalterPhillip M Kaylana Fenstermacher  [facility] Pager AMION. If 7PM-7AM, please contact night-coverage at [email or web address], password [email or web address] 01/28/2018, 1:00 PM  LOS: 2 days

## 2018-01-29 MED ORDER — DOXYCYCLINE HYCLATE 50 MG PO CAPS: 50.0000 mg | ORAL_CAPSULE | Freq: Two times a day (BID) | ORAL | 0 refills | Status: AC

## 2018-01-29 MED ORDER — TRAMADOL HCL 50 MG PO TABS: 50.0000 mg | ORAL_TABLET | Freq: Four times a day (QID) | ORAL | 0 refills | Status: DC | PRN

## 2018-01-29 MED ORDER — ONDANSETRON HCL 4 MG PO TABS: 4.0000 mg | ORAL_TABLET | Freq: Four times a day (QID) | ORAL | 0 refills | Status: DC | PRN

## 2018-01-29 MED ORDER — IBUPROFEN 800 MG PO TABS: 800.0000 mg | ORAL_TABLET | Freq: Three times a day (TID) | ORAL | 0 refills | Status: DC | PRN

## 2018-01-29 MED ORDER — PREDNISONE 10 MG PO TABS: 40.0000 mg | ORAL_TABLET | Freq: Every day | ORAL | 0 refills | Status: AC

## 2018-01-29 NOTE — Discharge Summary (Signed)
Physician Discharge Summary  Nicole [name] RUE:[id number] DOB: 02-10-1991 DOA: 01/26/2018  PCP: Patient, No Pcp Per  Admit date: 01/26/2018 Discharge date: 01/29/2018  Time spent: 35 minutes  Recommendations for Outpatient Follow-up:  1. F/u with PCP in 7 days   Discharge Diagnoses:  Principal Problem:   Arthritis Active Problems:   Polysubstance abuse [facility])   Discharge Condition: stable  Diet recommendation: regular  Filed Weights   01/26/18 1207  Weight: 56.7 kg (125 lb)    History of present illness:  Nicole [name] is a 27 y.o. female with medical history significant of active substance abuse with crack cocaine, no IVDA, presents with c/o  of arthralgias 3 week duration. It started in both ankles and feet which have since improved but now is associated with her left wrist and right-sided jaw.   She states she has had multiple ED visits and the Epic records reflect her last visit was to [facility] on 4 February.  Patient reports intermittent fevers. She did get placed on 10 days of prednisone which she thinks did not seem to help. Her main complaint today is severe left wrist pain 10 out of 10 associated with movement and stabbing in nature. She has been told she needs to see a primary care doctor or rheumatologist but she has no insurance and had been unable to follow-up with anybody yet.    Hospital Course:  Pt treated with a combination of steroids, anti-inflammatories and colchicine.  Abx also appeared effective. Was effective and patient symptoms improved.  Once patient was ambulatory she agreed to follow-up with rheumatology as outpatient.  MRI of the wrist showed inflammation of tendons. Pt d/c home. Cessation counseling for substance abuse given.  Procedures:  n/a  Consultations:  Ortho  Discharge Exam: Vitals:   01/29/18 0524 01/29/18 0930  BP: (!) 122/56 (!) 114/55  Pulse: 79 80  Resp: 18 18  Temp: 97.9 F (36.6 C) (!) 96.4 F (35.8 C)   SpO2: 100% 100%    General: NAD, NCAT Cardiovascular: RRR, no MRG Respiratory: nl wob, CTAB MSK: swelling R ankle minimum  Discharge Instructions   Discharge Instructions    Ambulatory referral to Internal Medicine   Complete by:  As directed    Call MD for:  extreme fatigue   Complete by:  As directed    Call MD for:  persistant dizziness or light-headedness   Complete by:  As directed    Call MD for:  redness, tenderness, or signs of infection (pain, swelling, redness, odor or green/yellow discharge around incision site)   Complete by:  As directed    Call MD for:  severe uncontrolled pain   Complete by:  As directed    Call MD for:  temperature >100.4   Complete by:  As directed    Diet - low sodium heart healthy   Complete by:  As directed    Discharge instructions   Complete by:  As directed    Take ibuprofen around the clock for next 2 days. Stay hydrated with water.  See your new doctor in the next 7 days. Ask them about your ANA drawn at the hospital.   Increase activity slowly   Complete by:  As directed      Allergies as of 01/29/2018      Reactions   Hydrocodone Itching      Medication List    TAKE these medications   ibuprofen 800 MG tablet Commonly known as:  ADVIL,MOTRIN Take 1  tablet (800 mg total) by mouth every 8 (eight) hours as needed. What changed:    medication strength  how much to take  when to take this  reasons to take this   ondansetron 4 MG tablet Commonly known as:  ZOFRAN Take 1 tablet (4 mg total) by mouth every 6 (six) hours as needed for nausea.   traMADol 50 MG tablet Commonly known as:  ULTRAM Take 1 tablet (50 mg total) by mouth every 6 (six) hours as needed.     ASK your doctor about these medications   doxycycline 50 MG capsule Commonly known as:  VIBRAMYCIN Take 1 capsule (50 mg total) by mouth 2 (two) times daily for 7 days. Ask about: Should I take this medication?   predniSONE 10 MG tablet Commonly known  as:  DELTASONE Take 4 tablets (40 mg total) by mouth daily for 7 days. Ask about: Should I take this medication?      Allergies  Allergen Reactions  . Hydrocodone Itching      The results of significant diagnostics from this hospitalization (including imaging, microbiology, ancillary and laboratory) are listed below for reference.    Significant Diagnostic Studies: Dg Orthopantogram  Result Date: 01/27/2018 CLINICAL DATA:  Pt reports pain in right jaw (non TMJ) x 3 weeks with no known injury. States that pain/swelling started in feet and moved up both legs and body until now residing in left wrist and right jaw. No hx of this pain in pt. EXAM: ORTHOPANTOGRAM/PANORAMIC COMPARISON:  CT maxillofacial, 10/26/2013. FINDINGS: No fracture.  No bone lesion. There are multiple fillings, but no evidence of an un treated carie. No periapical lucency is seen to suggest a root abscess. The temporomandibular joints are normally aligned. IMPRESSION: Negative exam. Electronically Signed   By: Amie Portlandavid  Ormond M.D.   On: 01/27/2018 21:42   Dg Wrist Complete Left  Result Date: 01/26/2018 CLINICAL DATA:  Severe left wrist pain for 1 week. No known injury. Initial encounter. EXAM: LEFT WRIST - COMPLETE 3+ VIEW COMPARISON:  10/26/2013 FINDINGS: There is no evidence of fracture or dislocation. There is no evidence of arthropathy or other focal bone abnormality. Soft tissues are unremarkable. IMPRESSION: Negative. Electronically Signed   By: Harmon PierJeffrey  Hu M.D.   On: 01/26/2018 13:08   Mr Wrist Left Wo Contrast  Result Date: 01/28/2018 CLINICAL DATA:  Three weeks of joint pain and arthritis. History of crack cocaine use and diagnosed with chlamydia in November, 2018. EXAM: MR OF THE LEFT WRIST WITHOUT CONTRAST TECHNIQUE: Multiplanar, multisequence MR imaging of the left wrist was performed. No intravenous contrast was administered. COMPARISON:  None. FINDINGS: Ligaments: Intact scapholunate and lunatotriquetral  ligaments. Triangular fibrocartilage: Partial tear of the thin central portion of the TFCC. Tendons: Tenosynovitis involving the abductor pollicis longus and extensor pollicis brevis characterized by edema and fluid surrounding the thickened appearing tendons within compartment 1 of the dorsal compartments of the wrist. Carpal tunnel/median nerve: Normal carpal tunnel. Normal median nerve. Guyon's canal: Normal. Joint/cartilage: Trace edema in the distal radioulnar joint. No chondral defect. Bones/carpal alignment: No marrow signal abnormality. Normal alignment. No aggressive osseous lesion. Other: A small ganglion cyst is noted along the volar radial aspect of the wrist measuring 4 x 5 x 6 mm. Subcutaneous soft tissue edema along the dorsum and radial aspect of the wrist. IMPRESSION: 1. Mild subcutaneous edema involving the dorsum as well as radial aspect of the wrist compatible with a cellulitis. No drainable fluid collections. 2. There is a small  ganglion cyst measuring 4 x 5 x 6 mm along the volar radial aspect of the wrist, adjacent to flexor tendons. 3. There is tenosynovitis involving the abductor pollicis longus and extensor pollicis brevis tendons of compartment 1. 4. Partial tear of the TFCC. A full-thickness microperforation is not entirely excluded given trace fluid in the distal radioulnar joint. Electronically Signed   By: Tollie Eth M.D.   On: 01/28/2018 22:45    Microbiology: Recent Results (from the past 240 hour(s))  Blood culture (routine x 2)     Status: None   Collection Time: 01/21/18  9:10 AM  Result Value Ref Range Status   Specimen Description   Final    BLOOD RIGHT ANTECUBITAL Performed at [facility], [address] [place], [place] [address]    Special Requests   Final    BOTTLES DRAWN AEROBIC AND ANAEROBIC Blood Culture adequate volume Performed at [facility], [address], [place], [place] [address]    Culture   Final    NO GROWTH 5  DAYS Performed at [facility], [address], [place], [place] [address]    Report Status 01/26/2018 FINAL  Final  Blood culture (routine x 2)     Status: None   Collection Time: 01/21/18  9:45 AM  Result Value Ref Range Status   Specimen Description   Final    BLOOD BLOOD LEFT ARM Performed at [facility], [address] [place], [place] [address]    Special Requests   Final    BOTTLES DRAWN AEROBIC AND ANAEROBIC Blood Culture adequate volume Performed at [facility], [address] [place], [place] [address]    Culture   Final    NO GROWTH 5 DAYS Performed at [facility], [address], [place], [place] [address]    Report Status 01/26/2018 FINAL  Final  Culture, blood (routine x 2)     Status: None (Preliminary result)   Collection Time: 01/26/18 12:40 PM  Result Value Ref Range Status   Specimen Description BLOOD RIGHT ANTECUBITAL  Final   Special Requests   Final    BOTTLES DRAWN AEROBIC AND ANAEROBIC Blood Culture adequate volume   Culture   Final    NO GROWTH 3 DAYS Performed at [facility], [address] [place], [place] [address]    Report Status PENDING  Incomplete  Culture, blood (routine x 2)     Status: None (Preliminary result)   Collection Time: 01/26/18  1:05 PM  Result Value Ref Range Status   Specimen Description BLOOD RIGHT HAND  Final   Special Requests IN PEDIATRIC BOTTLE Blood Culture adequate volume  Final   Culture   Final    NO GROWTH 3 DAYS Performed at [facility], [address], [place], [place] [address]    Report Status PENDING  Incomplete     Labs: Basic Metabolic Panel: Recent Labs  Lab 01/26/18 1124 01/26/18 2021 01/27/18 0925  NA 134*  --  136  K 5.1  --  4.0  CL 101  --  101  CO2 21*  --  23  GLUCOSE 117*  --  108*  BUN 16  --  8  CREATININE 0.82 0.84 0.74  CALCIUM 9.3  --  8.8*   Liver Function Tests: Recent Labs  Lab 01/26/18 1124  AST 21  ALT 17  ALKPHOS 82   BILITOT  0.5  PROT 8.2*  ALBUMIN 3.3*   Recent Labs  Lab 01/26/18 1124  LIPASE 30   No results for input(s): AMMONIA in the last 168 hours. CBC: Recent Labs  Lab 01/26/18 1124 01/26/18 2021 01/27/18 0925 01/28/18 0611  WBC 22.4* 10.8* 8.5 7.7  NEUTROABS  --   --   --  6.2  HGB 13.7 10.8* 11.4* 10.7*  HCT 40.9 33.5* 35.1* 33.0*  MCV 88.3 87.9 87.8 86.8  PLT 705* 531* 542* 533*   Cardiac Enzymes: No results for input(s): CKTOTAL, CKMB, CKMBINDEX, TROPONINI in the last 168 hours. BNP: BNP (last 3 results) No results for input(s): BNP in the last 8760 hours.  ProBNP (last 3 results) No results for input(s): PROBNP in the last 8760 hours.  CBG: Recent Labs  Lab 01/26/18 1603  GLUCAP 94       Signed:  Haydee Salter MD  FACP  [facility] 01/29/2018, 1:36 PM

## 2018-01-30 LAB — CHLAMYDIA CULTURE: Chlamydia Trachomatis Culture: NEGATIVE

## 2018-01-30 LAB — ANA W/REFLEX IF POSITIVE: ANA: NEGATIVE

## 2018-01-31 LAB — LYME, WESTERN BLOT, SERUM (REFLEXED)
IGG P18 AB.: ABSENT
IGG P30 AB.: ABSENT
IGG P41 AB.: ABSENT
IGG P45 AB.: ABSENT
IGG P58 AB.: ABSENT
IGG P93 AB.: ABSENT
IGM P23 AB.: ABSENT
IgG P23 Ab.: ABSENT
IgG P28 Ab.: ABSENT
IgG P39 Ab.: ABSENT
IgG P66 Ab.: ABSENT
IgM P41 Ab.: ABSENT
LYME IGG WB: NEGATIVE
Lyme IgM Wb: NEGATIVE

## 2018-01-31 LAB — CULTURE, BLOOD (ROUTINE X 2)
Culture: NO GROWTH
Culture: NO GROWTH
SPECIAL REQUESTS: ADEQUATE
Special Requests: ADEQUATE

## 2018-01-31 LAB — B. BURGDORFI ANTIBODIES: B BURGDORFERI AB IGG+ IGM: 1.29 {ISR} — AB (ref 0.00–0.90)

## 2018-05-02 ENCOUNTER — Encounter (HOSPITAL_COMMUNITY): Payer: Self-pay | Admitting: Emergency Medicine

## 2018-05-02 DIAGNOSIS — R1030 Lower abdominal pain, unspecified: Secondary | ICD-10-CM | POA: Insufficient documentation

## 2018-05-02 DIAGNOSIS — N939 Abnormal uterine and vaginal bleeding, unspecified: Secondary | ICD-10-CM | POA: Insufficient documentation

## 2018-05-02 LAB — I-STAT BETA HCG BLOOD, ED (MC, WL, AP ONLY): I-stat hCG, quantitative: <5 m[IU]/mL (ref <5)

## 2018-05-02 NOTE — ED Triage Notes (Signed)
Pt from home with c/o light vaginal bleeding that was heavier yesterday. Pt reports 3 pads used today. Pt states she had positive home pregnancy test and was possibly [redacted] weeks pregnant but had not seen OB yet. Pt reports mild lower right side abdominal cramping.

## 2018-05-03 ENCOUNTER — Emergency Department (HOSPITAL_COMMUNITY)
Admission: EM | Admit: 2018-05-03 | Discharge: 2018-05-03 | Disposition: A | Discharge status: Home / Self Care | Payer: Self-pay | Attending: Emergency Medicine | Admitting: Emergency Medicine

## 2018-05-03 DIAGNOSIS — N939 Abnormal uterine and vaginal bleeding, unspecified: Secondary | ICD-10-CM

## 2018-05-03 LAB — URINALYSIS, ROUTINE W REFLEX MICROSCOPIC
BILIRUBIN URINE: NEGATIVE
GLUCOSE, UA: NEGATIVE mg/dL
KETONES UR: 5 mg/dL — AB
LEUKOCYTES UA: NEGATIVE
Nitrite: POSITIVE — AB
PH: 5 (ref 5.0–8.0)
Protein, ur: NEGATIVE mg/dL
Specific Gravity, Urine: 1.03 (ref 1.005–1.030)

## 2018-05-03 LAB — BASIC METABOLIC PANEL
ANION GAP: 8 (ref 5–15)
BUN: 20 mg/dL (ref 6–20)
CHLORIDE: 110 mmol/L (ref 101–111)
CO2: 25 mmol/L (ref 22–32)
Calcium: 9.5 mg/dL (ref 8.9–10.3)
Creatinine, Ser: 0.96 mg/dL (ref 0.44–1.00)
GFR calc Af Amer: >60 mL/min (ref >60)
GFR calc non Af Amer: >60 mL/min (ref >60)
Glucose, Bld: 94 mg/dL (ref 65–99)
POTASSIUM: 4.3 mmol/L (ref 3.5–5.1)
SODIUM: 143 mmol/L (ref 135–145)

## 2018-05-03 LAB — WET PREP, GENITAL
SPERM: NONE SEEN
TRICH WET PREP: NONE SEEN
Yeast Wet Prep HPF POC: NONE SEEN

## 2018-05-03 LAB — CBC
HCT: 37 % (ref 36.0–46.0)
HEMOGLOBIN: 11.9 g/dL — AB (ref 12.0–15.0)
MCH: 28.6 pg (ref 26.0–34.0)
MCHC: 32.2 g/dL (ref 30.0–36.0)
MCV: 88.9 fL (ref 78.0–100.0)
Platelets: 358 10*3/uL (ref 150–400)
RBC: 4.16 MIL/uL (ref 3.87–5.11)
RDW: 14.7 % (ref 11.5–15.5)
WBC: 8.2 10*3/uL (ref 4.0–10.5)

## 2018-05-03 LAB — GC/CHLAMYDIA PROBE AMP (~~LOC~~) NOT AT ARMC
CHLAMYDIA, DNA PROBE: NEGATIVE
NEISSERIA GONORRHEA: NEGATIVE

## 2018-05-03 NOTE — Discharge Instructions (Addendum)
1. Medications: usual home medications -do not take ibuprofen or aspirin. 2. Treatment: rest, drink plenty of fluids, monitor bleeding 3. Follow Up: Please followup with your primary doctor and/or OB/GYN in 3-4 days days for discussion of your diagnoses and further evaluation after today's visit; if you do not have a primary care doctor use the resource guide provided to find one; Please return to the ER for worsening abdominal pain, persistent vomiting, high fevers, large volume bleeding or other concerns

## 2018-05-03 NOTE — ED Notes (Signed)
Pt left without signing or receiving discharge paperwork.

## 2018-05-03 NOTE — ED Provider Notes (Signed)
[facility]-EMERGENCY DEPT Provider Note   CSN: [id number] Arrival date & time: 05/02/18  2156     History   Chief Complaint Chief Complaint  Patient presents with  . Vaginal Bleeding    HPI Nicole [name] is a 27 y.o. female G2P0202 with a hx of depression, UTI, polysubstance abuse, scoliosis presents to the Emergency Department complaining of gradual, persistent, progressively worsening vaginal bleeding onset yesterday.  Pt reports she was spotting yesterday with some increase today.  She has not passed any clots.  She denies ASA and blood thinner usage.  Denies heavy bleeding. Associated symptoms include lower abd cramping.  Nothing makes it better and nothing makes it worse.  Pt reports she is sexually active with 1 female partner.  Pt reports this partner has been present for 6 mos and both tested neg for STD.  Pt reports consistent condom usage.  Pt denies fever, chills, headache, neck pain, chest pain, shortness of breath, nausea, vomiting, diarrhea, weakness, dizziness, syncope, dysuria, hematuria.  LMP: March (unknown date).  Patient reports that she took a home pregnancy test 2 days ago and she believes it was positive.   The history is provided by the patient and medical records. No language interpreter was used.    Past Medical History:  Diagnosis Date  . Depression    hx age 57, fine now  . Hypoglycemia   . Infection    UTI  . Irregular heart rate   . Scoliosis   . Syncope     Patient Active Problem List   Diagnosis Date Noted  . Arthritis 01/26/2018  . Polysubstance abuse (HCC) 01/26/2018  . Active labor 08/05/2015  . Diffuse abdominal pain 05/02/2015  . Abdominal pain during pregnancy, antepartum   . [redacted] weeks gestation of pregnancy   . [redacted] weeks gestation of pregnancy   . Encounter for fetal anatomic survey   . Hereditary disease in family possibly affecting fetus, affecting management of mother, antepartum condition or complication   .  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 MOUTH SURGERY       OB History    Gravida  2   Para  2   Term  2   Preterm      AB      Living  2     SAB      TAB      Ectopic      Multiple  0   Live Births  2            Home Medications    Prior to Admission medications   Not on File    Family History Family History  Problem Relation Age of Onset  . Cancer Mother 2       breast  . Cancer Maternal Grandmother        lung  . Diabetes Paternal Grandfather   . Otilio Jefferson sequence Son        diagnosed prenatally, delivered and followed postnatally with [facility]    Social History Social History   Tobacco Use  . Smoking status: Former Smoker    Packs/day: 0.25    Types: Cigarettes  . Smokeless tobacco: Never Used  Substance Use Topics  . Alcohol use: No    Comment: occ, prior to preg  . Drug use: No     Allergies   Hydrocodone   Review of Systems Review of Systems  Constitutional: Negative for appetite change, diaphoresis, fatigue, fever and unexpected weight change.  HENT: Negative for mouth sores.   Eyes: Negative for visual disturbance.  Respiratory: Negative for cough, chest tightness, shortness of breath and wheezing.   Cardiovascular: Negative for chest pain.  Gastrointestinal: Positive for abdominal pain ( Mild, lower, cramping). Negative for constipation, diarrhea, nausea and vomiting.  Endocrine: Negative for polydipsia, polyphagia and polyuria.  Genitourinary: Positive for vaginal bleeding. Negative for dysuria, frequency, hematuria and urgency.  Musculoskeletal: Negative for back pain and neck stiffness.  Skin: Negative for rash.  Allergic/Immunologic: Negative for immunocompromised state.  Neurological: Negative for syncope, light-headedness and headaches.  Hematological: Does not bruise/bleed easily.  Psychiatric/Behavioral: Negative for sleep disturbance. The patient is not  nervous/anxious.      Physical Exam Updated Vital Signs BP 106/67   Pulse 75   Temp 98.6 F (37 C) (Oral)   Resp 18   Ht  (1.753 m)   Wt 59 kg (130 lb)   LMP 03/02/2018   SpO2 100%   BMI 19.20 kg/m   Physical Exam  Constitutional: She appears well-developed and well-nourished. No distress.  Awake, alert, nontoxic appearance  HENT:  Head: Normocephalic and atraumatic.  Mouth/Throat: Oropharynx is clear and moist. No oropharyngeal exudate.  Eyes: Conjunctivae are normal. No scleral icterus.  Neck: Normal range of motion. Neck supple.  Cardiovascular: Normal rate, regular rhythm, normal heart sounds and intact distal pulses.  No murmur heard. Pulmonary/Chest: Effort normal and breath sounds normal. No respiratory distress. She has no wheezes.  Equal chest expansion  Abdominal: Soft. Bowel sounds are normal. She exhibits no mass. There is no tenderness. There is no rebound, no guarding and no CVA tenderness. Hernia confirmed negative in the right inguinal area and confirmed negative in the left inguinal area.  Genitourinary: Uterus normal. No labial fusion. There is no rash, tenderness or lesion on the right labia. There is no rash, tenderness or lesion on the left labia. Uterus is not deviated, not enlarged, not fixed and not tender. Cervix exhibits no motion tenderness, no discharge and no friability. Right adnexum displays no mass, no tenderness and no fullness. Left adnexum displays no mass, no tenderness and no fullness. There is bleeding ( scant) in the vagina. No erythema or tenderness in the vagina. No foreign body in the vagina. No signs of injury around the vagina. No vaginal discharge found.  Genitourinary Comments: Multiparous os is closed  Musculoskeletal: Normal range of motion. She exhibits no edema.  Lymphadenopathy:       Right: No inguinal adenopathy present.       Left: No inguinal adenopathy present.  Neurological: She is alert.  Speech is clear and goal  oriented Moves extremities without ataxia  Skin: Skin is warm and dry. She is not diaphoretic. No erythema.  Psychiatric: She has a normal mood and affect.  Nursing note and vitals reviewed.    ED Treatments / Results  Labs (all labs ordered are listed, but only abnormal results are displayed) Labs Reviewed  WET PREP, GENITAL - Abnormal; Notable for the following components:      Result Value   Clue Cells Wet Prep HPF POC PRESENT (*)    WBC, Wet Prep HPF POC MANY (*)    All other components within normal limits  CBC - Abnormal; Notable for the following components:   Hemoglobin 11.9 (*)    All other components within normal limits  URINALYSIS, ROUTINE W REFLEX MICROSCOPIC - Abnormal; Notable for  the following components:   Hgb urine dipstick SMALL (*)    Ketones, ur 5 (*)    Nitrite POSITIVE (*)    Bacteria, UA MANY (*)    All other components within normal limits  BASIC METABOLIC PANEL  I-STAT BETA HCG BLOOD, ED ([facility], [facility], [facility] ONLY)  GC/CHLAMYDIA PROBE AMP ([place]) NOT AT [facility]    Procedures Procedures (including critical care time)  Medications Ordered in ED Medications - No data to display   Initial Impression / Assessment and Plan / ED Course  I have reviewed the triage vital signs and the nursing notes.  Pertinent labs & imaging results that were available during my care of the patient were reviewed by me and considered in my medical decision making (see chart for details).  Clinical Course as of May 03 646  Fri May 03, 2018  0209 Mild anemia, at baseline.  Hemoglobin(!): 11.9 [HM]    Clinical Course User Index [HM] Rochella Benner, Dahlia Client, PA-C    Patient presents with complaints of potential miscarriage.  She reports possible home pregnancy test several days ago.  hCG here is negative.  Mild anemia appears to be baseline.  Patient is afebrile without tachycardia.  One documented blood pressure of 84/50 however repeat does not show persistent hypotension.   Patient without complaints of dizziness, lightheadedness or near syncope.  I believe this was an apparent reading.  Labs otherwise reassuring.  Urinalysis does show nitrites but no white blood cells.  Patient denies dysuria or other urinary symptoms.  Her wet prep has clue cells and many white blood cells.  On exam however, she does not have vaginal discharge, cervical motion tenderness or adnexal tenderness.  STD cultures are pending.  I discussed with patient her negative pregnancy test and the need for close OB/GYN follow-up at the women's clinic.  She did state understanding.  04:30  After patient's wet prep resulted, I entered the room to discuss the results with the patient.  It appears as if she eloped as she, her mother and all of her belongings or gone.  I was unable to discuss the risk versus benefit of potential STD treatment and urine culture with her due to this.  Her last set of vitals were reassuring.   Final Clinical Impressions(s) / ED Diagnoses   Final diagnoses:  Vaginal bleeding    ED Discharge Orders    None       Mardene Sayer Boyd Kerbs 05/03/18 1610    Zadie Rhine, MD 05/03/18 2324

## 2018-11-19 ENCOUNTER — Inpatient Hospital Stay (HOSPITAL_COMMUNITY)
Admission: EM | Admit: 2018-11-19 | Discharge: 2018-11-23 | DRG: 549 | Disposition: A | Discharge status: Home / Self Care | Payer: Self-pay | Attending: Internal Medicine | Admitting: Internal Medicine

## 2018-11-19 ENCOUNTER — Emergency Department (HOSPITAL_COMMUNITY): Payer: Self-pay

## 2018-11-19 ENCOUNTER — Encounter (HOSPITAL_COMMUNITY): Payer: Self-pay

## 2018-11-19 DIAGNOSIS — Z87891 Personal history of nicotine dependence: Secondary | ICD-10-CM

## 2018-11-19 DIAGNOSIS — F1120 Opioid dependence, uncomplicated: Secondary | ICD-10-CM | POA: Diagnosis present

## 2018-11-19 DIAGNOSIS — M009 Pyogenic arthritis, unspecified: Secondary | ICD-10-CM

## 2018-11-19 DIAGNOSIS — Z885 Allergy status to narcotic agent status: Secondary | ICD-10-CM

## 2018-11-19 DIAGNOSIS — F1910 Other psychoactive substance abuse, uncomplicated: Secondary | ICD-10-CM | POA: Diagnosis present

## 2018-11-19 DIAGNOSIS — F419 Anxiety disorder, unspecified: Secondary | ICD-10-CM | POA: Diagnosis present

## 2018-11-19 DIAGNOSIS — L03114 Cellulitis of left upper limb: Secondary | ICD-10-CM | POA: Diagnosis present

## 2018-11-19 DIAGNOSIS — R000 Tachycardia, unspecified: Secondary | ICD-10-CM | POA: Diagnosis present

## 2018-11-19 DIAGNOSIS — M00822 Arthritis due to other bacteria, left elbow: Principal | ICD-10-CM | POA: Diagnosis present

## 2018-11-19 DIAGNOSIS — D638 Anemia in other chronic diseases classified elsewhere: Secondary | ICD-10-CM | POA: Diagnosis present

## 2018-11-19 DIAGNOSIS — Z79899 Other long term (current) drug therapy: Secondary | ICD-10-CM

## 2018-11-19 HISTORY — DX: Gastro-esophageal reflux disease without esophagitis: K21.9

## 2018-11-19 HISTORY — DX: Urinary tract infection, site not specified: N39.0

## 2018-11-19 HISTORY — DX: Cardiac arrhythmia, unspecified: I49.9

## 2018-11-19 HISTORY — DX: Unspecified convulsions: R56.9

## 2018-11-19 HISTORY — DX: Headache: R51

## 2018-11-19 HISTORY — DX: Headache, unspecified: R51.9

## 2018-11-19 HISTORY — DX: Exercise induced bronchospasm: J45.990

## 2018-11-19 LAB — CBC WITH DIFFERENTIAL/PLATELET
ABS IMMATURE GRANULOCYTES: 0.02 10*3/uL (ref 0.00–0.07)
BASOS ABS: 0 10*3/uL (ref 0.0–0.1)
BASOS PCT: 0 %
EOS ABS: 0.2 10*3/uL (ref 0.0–0.5)
Eosinophils Relative: 2 %
HEMATOCRIT: 35.8 % — AB (ref 36.0–46.0)
Hemoglobin: 11.3 g/dL — ABNORMAL LOW (ref 12.0–15.0)
IMMATURE GRANULOCYTES: 0 %
LYMPHS ABS: 2.2 10*3/uL (ref 0.7–4.0)
Lymphocytes Relative: 24 %
MCH: 27 pg (ref 26.0–34.0)
MCHC: 31.6 g/dL (ref 30.0–36.0)
MCV: 85.4 fL (ref 80.0–100.0)
MONOS PCT: 5 %
Monocytes Absolute: 0.5 10*3/uL (ref 0.1–1.0)
NEUTROS ABS: 6.1 10*3/uL (ref 1.7–7.7)
NEUTROS PCT: 69 %
NRBC: 0 % (ref 0.0–0.2)
PLATELETS: 343 10*3/uL (ref 150–400)
RBC: 4.19 MIL/uL (ref 3.87–5.11)
RDW: 15.3 % (ref 11.5–15.5)
WBC: 9 10*3/uL (ref 4.0–10.5)

## 2018-11-19 LAB — COMPREHENSIVE METABOLIC PANEL
ALBUMIN: 3.6 g/dL (ref 3.5–5.0)
ALK PHOS: 119 U/L (ref 38–126)
ALT: 43 U/L (ref 0–44)
ANION GAP: 9 (ref 5–15)
AST: 46 U/L — ABNORMAL HIGH (ref 15–41)
BILIRUBIN TOTAL: 0.9 mg/dL (ref 0.3–1.2)
BUN: 7 mg/dL (ref 6–20)
CALCIUM: 9.2 mg/dL (ref 8.9–10.3)
CO2: 28 mmol/L (ref 22–32)
Chloride: 98 mmol/L (ref 98–111)
Creatinine, Ser: 0.75 mg/dL (ref 0.44–1.00)
GLUCOSE: 117 mg/dL — AB (ref 70–99)
Potassium: 4 mmol/L (ref 3.5–5.1)
Sodium: 135 mmol/L (ref 135–145)
TOTAL PROTEIN: 8.1 g/dL (ref 6.5–8.1)

## 2018-11-19 LAB — RAPID URINE DRUG SCREEN, HOSP PERFORMED
AMPHETAMINES: POSITIVE — AB
BENZODIAZEPINES: NOT DETECTED
Barbiturates: NOT DETECTED
Cocaine: NOT DETECTED
OPIATES: POSITIVE — AB
Tetrahydrocannabinol: POSITIVE — AB

## 2018-11-19 LAB — I-STAT BETA HCG BLOOD, ED (MC, WL, AP ONLY)

## 2018-11-19 MED ORDER — IOPAMIDOL (ISOVUE-370) INJECTION 76%
INTRAVENOUS | Status: AC
  Filled 2018-11-19: qty 100

## 2018-11-19 MED ORDER — ONDANSETRON HCL 4 MG PO TABS: 4.0000 mg | ORAL_TABLET | Freq: Four times a day (QID) | ORAL | Status: DC | PRN

## 2018-11-19 MED ORDER — NICOTINE 21 MG/24HR TD PT24
21.0000 mg | MEDICATED_PATCH | Freq: Once | TRANSDERMAL | Status: DC
  Filled 2018-11-19: qty 1

## 2018-11-19 MED ORDER — MORPHINE SULFATE (PF) 2 MG/ML IV SOLN
2.0000 mg | Freq: Once | INTRAVENOUS | Status: AC
  Administered 2018-11-19: 2 mg via INTRAVENOUS
  Filled 2018-11-19: qty 1

## 2018-11-19 MED ORDER — BISACODYL 10 MG RE SUPP: 10.0000 mg | Freq: Every day | RECTAL | Status: DC | PRN

## 2018-11-19 MED ORDER — VANCOMYCIN HCL IN DEXTROSE 1-5 GM/200ML-% IV SOLN
1000.0000 mg | Freq: Two times a day (BID) | INTRAVENOUS | Status: DC
  Administered 2018-11-20 – 2018-11-23 (×7): 1000 mg via INTRAVENOUS
  Filled 2018-11-19 (×8): qty 200

## 2018-11-19 MED ORDER — ONDANSETRON HCL 4 MG/2ML IJ SOLN
4.0000 mg | Freq: Four times a day (QID) | INTRAMUSCULAR | Status: DC | PRN
  Administered 2018-11-20: 4 mg via INTRAVENOUS
  Filled 2018-11-19: qty 2

## 2018-11-19 MED ORDER — DOCUSATE SODIUM 100 MG PO CAPS: 100.0000 mg | ORAL_CAPSULE | Freq: Two times a day (BID) | ORAL | Status: DC | PRN

## 2018-11-19 MED ORDER — IOPAMIDOL (ISOVUE-370) INJECTION 76%
100.0000 mL | Freq: Once | INTRAVENOUS | Status: AC | PRN
  Administered 2018-11-19: 100 mL via INTRAVENOUS

## 2018-11-19 MED ORDER — SODIUM CHLORIDE 0.9 % IV BOLUS
1000.0000 mL | Freq: Once | INTRAVENOUS | Status: AC
  Administered 2018-11-19: 1000 mL via INTRAVENOUS

## 2018-11-19 MED ORDER — FENTANYL CITRATE (PF) 100 MCG/2ML IJ SOLN
50.0000 ug | Freq: Once | INTRAMUSCULAR | Status: AC
  Administered 2018-11-19: 50 ug via INTRAVENOUS
  Filled 2018-11-19: qty 2

## 2018-11-19 MED ORDER — SODIUM CHLORIDE 0.9 % IV SOLN
INTRAVENOUS | Status: DC
  Administered 2018-11-19 – 2018-11-22 (×5): via INTRAVENOUS

## 2018-11-19 MED ORDER — ENOXAPARIN SODIUM 40 MG/0.4ML ~~LOC~~ SOLN
40.0000 mg | SUBCUTANEOUS | Status: DC
  Filled 2018-11-19 (×3): qty 0.4

## 2018-11-19 MED ORDER — VANCOMYCIN HCL IN DEXTROSE 1-5 GM/200ML-% IV SOLN
1000.0000 mg | Freq: Once | INTRAVENOUS | Status: AC
  Administered 2018-11-19: 1000 mg via INTRAVENOUS
  Filled 2018-11-19: qty 200

## 2018-11-19 NOTE — ED Notes (Addendum)
Upon pt's arrival to Rm 53, pt advised she needed to use the restroom and attempted to take a personal bag w/her into room. RN advised pt she may take product she needs but recommended she not take bag as no place to place bag. Pt then attempted to take small box in bathroom w/her - refused to show RN what was in box - stated "I have my feminine products". Pt then sat on bed and stated "I don't really need to go". Mother has arrived to bedside.

## 2018-11-19 NOTE — ED Notes (Signed)
Pt called out requesting pain medications. No active pain medicines ordered. Will page MD. Pt aware of plan. Family remains at the bedside.

## 2018-11-19 NOTE — ED Notes (Signed)
Patient transported to CT 

## 2018-11-19 NOTE — ED Notes (Signed)
Leotis ShamesJeffery, PA at bedside.

## 2018-11-19 NOTE — ED Notes (Signed)
Paged Merdis DelayK. Schorr, PA to [place], RN

## 2018-11-19 NOTE — ED Notes (Signed)
RN and IV team unsuccesful with IV,EDP at bedside

## 2018-11-19 NOTE — Consult Note (Addendum)
Reason for Consult:Left AC fossa infection Referring Physician: A Curatolo  Nicole [name] is an 27 y.o. female.  HPI: Nicole [name] was in her usual state of health until Saturday when she noticed some mild left elbow pain around midday. By that night it had gotten red and was moderately swollen. It continued to worsen over the next 2d. Sometime overnight it spontaneously drained a copious amount of bloody discharge and her family insisted she come to the hospital. She denies fever, chills, sweats, N/V. She is LHD.  Past Medical History:  Diagnosis Date  . Depression    hx age 63, fine now  . Hypoglycemia   . Infection    UTI  . Irregular heart rate   . Scoliosis   . Syncope     Past Surgical History:  Procedure Laterality Date  . MOUTH SURGERY      Family History  Problem Relation Age of Onset  . Cancer Mother 39       breast  . Cancer Maternal Grandmother        lung  . Diabetes Paternal Grandfather   . Otilio Jefferson sequence Son        diagnosed prenatally, delivered and followed postnatally with [facility]    Social History:  reports that she has quit smoking. Her smoking use included cigarettes. She smoked 0.25 packs per day. She has never used smokeless tobacco. She reports that she does not drink alcohol or use drugs.  Allergies:  Allergies  Allergen Reactions  . Hydrocodone Hives and Itching    Medications: I have reviewed the patient's current medications.  Results for orders placed or performed during the hospital encounter of 11/19/18 (from the past 48 hour(s))  Urine rapid drug screen (hosp performed)     Status: Abnormal   Collection Time: 11/19/18  2:05 PM  Result Value Ref Range   Opiates POSITIVE (A) NONE DETECTED   Cocaine NONE DETECTED NONE DETECTED   Benzodiazepines NONE DETECTED NONE DETECTED   Amphetamines POSITIVE (A) NONE DETECTED   Tetrahydrocannabinol POSITIVE (A) NONE DETECTED   Barbiturates NONE DETECTED NONE DETECTED    Comment: (NOTE) DRUG SCREEN  FOR MEDICAL PURPOSES ONLY.  IF CONFIRMATION IS NEEDED FOR ANY PURPOSE, NOTIFY LAB WITHIN 5 DAYS. LOWEST DETECTABLE LIMITS FOR URINE DRUG SCREEN Drug Class                     Cutoff (ng/mL) Amphetamine and metabolites    1000 Barbiturate and metabolites    200 Benzodiazepine                 200 Tricyclics and metabolites     300 Opiates and metabolites        300 Cocaine and metabolites        300 THC                            50 Performed at [facility], [address], [place], [place] [address]     No results found.  Review of Systems  Constitutional: Negative for weight loss.  HENT: Negative for ear discharge, ear pain, hearing loss and tinnitus.   Eyes: Negative for blurred vision, double vision, photophobia and pain.  Respiratory: Negative for cough, sputum production and shortness of breath.   Cardiovascular: Negative for chest pain.  Gastrointestinal: Negative for abdominal pain, nausea and vomiting.  Genitourinary: Negative for dysuria, flank pain, frequency and urgency.  Musculoskeletal:  Positive for joint pain (Left elbow). Negative for back pain, falls, myalgias and neck pain.  Neurological: Negative for dizziness, tingling, sensory change, focal weakness, loss of consciousness and headaches.  Endo/Heme/Allergies: Does not bruise/bleed easily.  Psychiatric/Behavioral: Negative for depression, memory loss and substance abuse. The patient is not nervous/anxious.    Blood pressure 118/79, pulse (!) 103, temperature 98.7 F (37.1 C), temperature source Oral, resp. rate 18, height 5\' 9"  (1.753 m), weight 59 kg, last menstrual period 11/15/2018, SpO2 100 %, unknown if currently breastfeeding. Physical Exam  Constitutional: She appears well-developed and well-nourished. No distress.  HENT:  Head: Normocephalic and atraumatic.  Eyes: Conjunctivae are normal. Right eye exhibits no discharge. Left eye exhibits no discharge. No scleral icterus.  Neck: Normal range of  motion.  Cardiovascular: Normal rate and regular rhythm.  Respiratory: Effort normal. No respiratory distress.  Musculoskeletal:  Left shoulder, elbow, wrist, digits- excoriations about AC fossa, punctate wound with recent discharge in middle, exquisitely TTP, AROM 160 - 100, edematous, streaking erythema superiorly with plethora distally, no instability, no blocks to motion  Sens  Ax/R/M/U intact  Mot   Ax/ R/ PIN/ M/ AIN/ U intact  Rad 2+  Neurological: She is alert.  Skin: Skin is warm and dry. She is not diaphoretic.  Psychiatric: She has a normal mood and affect. Her behavior is normal.    Assessment/Plan: Left AC fossa abscess -- Awaiting CT but feel fairly certain that she still needs formal I&D. Plan on I&D by Dr. Jena GaussHaddix tomorrow. 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 Stevie Charter, PA-C Orthopedic Surgery [phone] 11/19/2018, 3:44 PM

## 2018-11-19 NOTE — H&P (Signed)
History and Physical    Nicole [name] ONG:[id number] DOB: May 25, 1991 DOA: 11/19/2018  PCP: Patient, No Pcp Per Consultants:  orthopedics Patient coming from: home  Chief Complaint: arm pain  HPI: Nicole [name] is a 27 y.o. female with medical history significant for IVDA (heroin) who presented to the ED today with c/o left arm pain, redness and swelling.  She states that on Saturday she was putting her purse on her left arm and noticed that her left arm had become swollen around the elbow.  She developed increased swelling and redness on Sunday and Monday as well as pain.  She states that sometime during the night last night the wound opened up and drained bloody and purulent discharge.  She states that she has had no fevers or chills.  She states that she used to use heroin but has not used it in several weeks.  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  She denies nausea vomiting diarrhea.  The pain and swelling have gotten to the point where she has trouble bending her elbow.   ED Course: She was afebrile, heart rate 103, blood pressure 118/79, respiratory rate 18.  CT was ordered, still pending.  She was evaluated by orthopedic surgery who plan to take patient to the OR tonight for I&D.  Review of Systems: As per HPI; otherwise review of systems reviewed and negative.   Ambulatory Status:  Ambulates without assistance  Past Medical History:  Diagnosis Date  . Depression    hx age 9, fine now  . Hypoglycemia   . Infection    UTI  . Irregular heart rate   . Scoliosis   . Syncope     Past Surgical History:  Procedure Laterality Date  . MOUTH SURGERY      Social History   Socioeconomic History  . Marital status: Single    Spouse name: Not on file  . Number of children: Not on file  . Years of education: Not on file  . Highest education level: Not on file  Occupational History  . Not on file  Social Needs  . Financial resource strain: Not on file  .  Food insecurity:    Worry: Not on file    Inability: Not on file  . Transportation needs:    Medical: Not on file    Non-medical: Not on file  Tobacco Use  . Smoking status: Former Smoker    Packs/day: 0.25    Types: Cigarettes  . Smokeless tobacco: Never Used  Substance and Sexual Activity  . Alcohol use: No    Comment: occ, prior to preg  . Drug use: No  . Sexual activity: Yes    Comment: Last sexual encounter 2 months ago, used condom  Lifestyle  . Physical activity:    Days per week: Not on file    Minutes per session: Not on file  . Stress: Not on file  Relationships  . Social connections:    Talks on phone: Not on file    Gets together: Not on file    Attends religious service: Not on file    Active member of club or organization: Not on file    Attends meetings of clubs or organizations: Not on file    Relationship status: Not on file  . Intimate partner violence:    Fear of current or ex partner: Not on file    Emotionally abused: Not on file    Physically abused: Not on file  Forced sexual activity: Not on file  Other Topics Concern  . Not on file  Social History Narrative  . Not on file    Allergies  Allergen Reactions  . Hydrocodone Hives and Itching    Family History  Problem Relation Age of Onset  . Cancer Mother 35       breast  . Cancer Maternal Grandmother        lung  . Diabetes Paternal Grandfather   . Otilio Jefferson sequence Son        diagnosed prenatally, delivered and followed postnat[facility] with [facility]    Prior to Admission medications   Medication Sig Start Date End Date Taking? Authorizing Provider  buprenorphine (SUBUTEX) 2 MG SUBL SL tablet Place under the tongue daily.   Yes [provider]  clonazePAM (KLONOPIN) 0.5 MG tablet Take by mouth.   Yes [provider]  OVER THE COUNTER MEDICATION Take 1 capsule by mouth daily. QueenV Daily Probiotic   Yes [provider]    Physical Exam: Vitals:   11/19/18  1350 11/19/18 1609 11/19/18 1655  BP: 118/79 125/82 112/65  Pulse: (!) 103 92 72  Resp: 18 18 16   Temp: 98.7 F (37.1 C)    TempSrc: Oral    SpO2: 100% 100% 99%  Weight: 59 kg    Height: 5\' 9"  (1.753 m)       . General: Appears calm and comfortable and is in NAD . Eyes:  PERRL, EOMI, normal lids, iris . ENT:  grossly normal hearing, lips & tongue, mmm . Neck:  supple, no lymphadenopathy . Cardiovascular:  nL S1, S2, normal rate, reg rhythm, no murmur. Marland Kitchen Respiratory:   CTA bilaterally with no wheezes/rales/rhonchi.  Normal respiratory effort. . Abdomen:  soft, NT, ND, NABS . Back:   grossly normal alignment . Skin:  no rash or lesions seen on limited exam . Musculoskeletal:  grossly normal tone BUE/BLE, good ROM, no bony abnormality or obvious joint deformity . Extremities: Left arm around the elbow joint is severely swollen, exquisitely tender to palpation, streaking is present superior to the AC fossa. She has dried serosanguinous discharge on the surrounding skin. Central wound with dried/gelatinous blood. ottling is present in fingers, she has normal sensation and motor function of her forearm, wrist and hand. 2+ radial pulse. She is unable to bend her elbow past 90 degrees.  No LE edema.  Limited foot exam with no ulcerations.  2+ distal pulses. Marland Kitchen Psychiatric:  grossly normal mood and affect, speech fluent and appropriate, AOx3 . Neurologic:  CN 2-12 grossly intact, moves all extremities in coordinated fashion, sensation intact, Patellar DTRs 2+ and symmetric    Radiological Exams on Admission: No results found.  EKG: Not done   Labs on Admission: I have personally reviewed the available labs and imaging studies at the time of the admission.  Pertinent labs:  Sodium 135 potassium 4.0 chloride 98 CO2 28 glucose 117 BUN 7 creatinine 0.75 calcium 9.2 LFTs unremarkable WBC 9.0 hemoglobin 11.3 platelets 343   Assessment/Plan Principal Problem:   Septic arthritis of elbow,  left (HCC) Active Problems:   Polysubstance abuse (HCC)   Tachycardia   Cellulitis, abscess, and likely septic arthritis of left elbow: Patient reports that she has had no recent IV drug use.  However, the wound appears to be an attempted intravenous puncture which became infected.  She has extensive edema and streaking erythema of the left arm.  She has a left AC fossa abscess with likely  septic arthritis.  CT has been ordered and is pending. She been evaluated by orthopedics and the plan is to take her to the OR tonight for formal I&D.  She is currently hemodynamically stable other than mild tachycardia. -N.p.o. -Vancomycin per pharmacy -Follow-up blood cultures -Follow-up CT  History of IV drug abuse: Patient takes Subutex, unknown dose.  [place] not know [place] name.  Patient will likely require opiate pain medication perioperatively so we will hold off on ordering Subutex for now.  Noted urine drug screen positive for opiates, methamphetamine, THC.  Tachycardia: appears sinus, likely 2/2 pain, infection. No other s/s sepsis. Cont to monitor. Will give gentle IVF.   DVT prophylaxis: lovenox, hold until tomorrow as pt going to OR tonight Code Status:  Full - confirmed with patient/family Family Communication: mom at bedside  Disposition Plan:  Home once clinically improved Consults called: orthopedics  Admission status: .Admit - It is my clinical opinion that admission to INPATIENT is reasonable and necessary because of the expectation that this patient will require hospital care that crosses at least 2 midnights to treat this condition based on the medical complexity of the problems presented.  Given the aforementioned information, the predictability of an adverse outcome is felt to be significant.     Elyse HsuSally M Elizeo Rodriques MD [facility]  If note is complete, please contact covering daytime or nighttime  physician. [email or web address] Password [email or web address]  11/19/2018, 6:18 PM

## 2018-11-19 NOTE — ED Provider Notes (Signed)
[facility] EMERGENCY DEPARTMENT Provider Note   CSN: [id number] Arrival date & time: 11/19/18  1333     History   Chief Complaint Chief Complaint  Patient presents with  . Arm Pain    HPI Nicole [name] is a 27 y.o. female.  27 year old female with history of hypoglycemia, irregular heart rate, previous IV drug use presents with complaint of left arm swelling, pain, drainage.  Patient states symptoms started 3 to 4 days ago, getting progressively worse.  Patient denies fevers or chills.  Denies recent IV drug use or injury to this arm.  No other complaints or concerns.     Past Medical History:  Diagnosis Date  . Depression    hx age 84, fine now  . Hypoglycemia   . Infection    UTI  . Irregular heart rate   . Scoliosis   . Syncope     Patient Active Problem List   Diagnosis Date Noted  . Arthritis 01/26/2018  . Polysubstance abuse (HCC) 01/26/2018  . Active labor 08/05/2015  . Diffuse abdominal pain 05/02/2015  . Abdominal pain during pregnancy, antepartum   . [redacted] weeks gestation of pregnancy   . [redacted] weeks gestation of pregnancy   . Encounter for fetal anatomic survey   . Hereditary disease in family possibly affecting fetus, affecting management of mother, antepartum condition or complication   . 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 MOUTH SURGERY       OB History    Gravida  2   Para  2   Term  2   Preterm      AB      Living  2     SAB      TAB      Ectopic      Multiple  0   Live Births  2            Home Medications    Prior to Admission medications   Medication Sig Start Date End Date Taking? Authorizing Provider  OVER THE COUNTER MEDICATION Take 1 capsule by mouth daily. QueenV Daily Probiotic   Yes [provider]    Family History Family History  Problem Relation Age of Onset  . Cancer Mother 71       breast  . Cancer Maternal  Grandmother        lung  . Diabetes Paternal Grandfather   . Otilio Jefferson sequence Son        diagnosed prenatally, delivered and followed [facility]nat[facility] with [facility]    Social History Social History   Tobacco Use  . Smoking status: Former Smoker    Packs/day: 0.25    Types: Cigarettes  . Smokeless tobacco: Never Used  Substance Use Topics  . Alcohol use: No    Comment: occ, prior to preg  . Drug use: No     Allergies   Hydrocodone   Review of Systems Review of Systems  Constitutional: Negative for chills and fever.  Gastrointestinal: Negative for nausea and vomiting.  Musculoskeletal: Positive for arthralgias, joint swelling and myalgias.  Skin: Positive for color change and wound.  Allergic/Immunologic: Negative for immunocompromised state.  Neurological: Negative for numbness.  Hematological: Does not bruise/bleed easily.  Psychiatric/Behavioral: Negative for confusion.  All other systems reviewed and are negative.    Physical Exam Updated Vital Signs BP 125/82   Pulse 92   Temp 98.7 F (37.1  C) (Oral)   Resp 18   Ht 5\' 9"  (1.753 m)   Wt 59 kg   LMP 11/15/2018 (Approximate)   SpO2 100%   BMI 19.20 kg/m   Physical Exam  Constitutional: She is oriented to person, place, and time. She appears well-developed and well-nourished.  Obvious discomfort  HENT:  Head: Normocephalic and atraumatic.  Cardiovascular: Intact distal pulses.  Pulmonary/Chest: Effort normal.  Musculoskeletal: She exhibits edema and tenderness. She exhibits no deformity.       Left elbow: She exhibits decreased range of motion, swelling and effusion. Tenderness found.       Arms: Neurological: She is alert and oriented to person, place, and time. No sensory deficit.  Skin: Skin is dry. She is not diaphoretic. There is erythema.  Both hands are cool to the touch, delayed capillary refill left hand, palpable radial pulse  Psychiatric: She has a normal mood and affect. Her behavior is normal.   Nursing note and vitals reviewed.    ED Treatments / Results  Labs (all labs ordered are listed, but only abnormal results are displayed) Labs Reviewed  COMPREHENSIVE METABOLIC PANEL - Abnormal; Notable for the following components:      Result Value   Glucose, Bld 117 (*)    AST 46 (*)    All other components within normal limits  RAPID URINE DRUG SCREEN, HOSP PERFORMED - Abnormal; Notable for the following components:   Opiates POSITIVE (*)    Amphetamines POSITIVE (*)    Tetrahydrocannabinol POSITIVE (*)    All other components within normal limits  CBC WITH DIFFERENTIAL/PLATELET - Abnormal; Notable for the following components:   Hemoglobin 11.3 (*)    HCT 35.8 (*)    All other components within normal limits  CULTURE, BLOOD (ROUTINE X 2)  CULTURE, BLOOD (ROUTINE X 2)  AEROBIC CULTURE (SUPERFICIAL SPECIMEN)  I-STAT BETA HCG BLOOD, ED ([facility], [facility], [facility] ONLY)    EKG None  Radiology No results found.  Procedures Procedures (including critical care time)  Medications Ordered in ED Medications  sodium chloride 0.9 % bolus 1,000 mL (1,000 mLs Intravenous New Bag/Given 11/19/18 1606)  vancomycin (VANCOCIN) IVPB 1000 mg/200 mL premix (has no administration in time range)  iopamidol (ISOVUE-370) 76 % injection (has no administration in time range)  nicotine (NICODERM CQ - dosed in mg/24 hours) patch 21 mg (has no administration in time range)  iopamidol (ISOVUE-370) 76 % injection 100 mL (has no administration in time range)  fentaNYL (SUBLIMAZE) injection 50 mcg (50 mcg Intravenous Given 11/19/18 1610)     Initial Impression / Assessment and Plan / ED Course  I have reviewed the triage vital signs and the nursing notes.  Pertinent labs & imaging results that were available during my care of the patient were reviewed by me and considered in my medical decision making (see chart for details).  Clinical Course as of Nov 19 1648  Tue Nov 19, 2018  1457 Discussed with Charma Igo, PA-C orthopedics who will see the patient.    [LM]  8172 27 year old female with history of IV drug use presents with left arm infection with concern for septic arthritis.  Ortho has been consulted and has seen the patient, plans for surgery however patient ate prior to arrival in the emergency room.  Patient is where she is n.p.o., requests consult to hospitalist service for admission.  Awaiting basic labs for medicine admission.   [LM]  1648 Case discussed with Dr. Rennis Chris with [facility] hospitalist service  who will admit patient.   [LM]    Clinical Course User Index [LM] Jeannie FendMurphy, Laura A, PA-C   Final Clinical Impressions(s) / ED Diagnoses   Final diagnoses:  Pyogenic arthritis of left elbow, due to unspecified organism [facility](HCC)  Polysubstance abuse [facility](HCC)    ED Discharge Orders    None       Jeannie FendMurphy, Laura A, PA-C 11/19/18 1649    Virgina Norfolkuratolo, Adam, DO 11/19/18 1831

## 2018-11-19 NOTE — ED Notes (Signed)
Pt ambulatory to restroom

## 2018-11-19 NOTE — Progress Notes (Signed)
Pharmacy Antibiotic Note  Alfredo BachWhitney Liberatore is a 27 y.o. female admitted on 11/19/2018 with cellulitis.  Pharmacy has been consulted for vancomycin dosing. She presents with swelling and redness on left arm. She is scheduled for I&D today  WBC wnl, SCr 0.75, afebrile   Plan: -Vancomycin 1 gm IV Q 12 hours -Monitor CBC, renal fx, cultures and clinical progress -Vanc levels as indicated   Height: 5\' 9"  (175.3 cm) Weight: 130 lb (59 kg) IBW/kg (Calculated) : 66.2  Temp (24hrs), Avg:98.7 F (37.1 C), Min:98.7 F (37.1 C), Max:98.7 F (37.1 C)  Recent Labs  Lab 11/19/18 1532  WBC 9.0  CREATININE 0.75    Estimated Creatinine Clearance: 98.4 mL/min (by C-G formula based on SCr of 0.75 mg/dL).    Allergies  Allergen Reactions  . Hydrocodone Hives and Itching    Antimicrobials this admission: Vanc 12/3 >>   Thank you for allowing pharmacy to be a part of this patient's care.  Vinnie LevelBenjamin Anayia Eugene, PharmD., BCPS Clinical Pharmacist Clinical phone for 11/19/18 until 11pm: [phone]

## 2018-11-19 NOTE — ED Triage Notes (Signed)
Pt presents with 2 day h/o red, swollen L arm.  Pt denies any injury, reports area opened and drained last night.

## 2018-11-19 NOTE — ED Notes (Signed)
Attempted PIV placement x 2.

## 2018-11-19 NOTE — ED Notes (Addendum)
Pt went into bathroom - took supplies to bathroom w/her - would not show staff - Mother states "I can assure you she did not take any drugs into the bathroom w/her. She has not used in 3 weeks. She's trying to get clean". Advised pt and mother she has Vanc infusing in IV and advised ensuring pt is not attempting to inject anything in IV. Mother standing outside of door to bathroom. Pt has been in bathroom for at least 10 min - mother states pt is "brushing her teeth". RN opened door to bathroom - pt had flushed the toilet and standing at sink.

## 2018-11-19 NOTE — ED Notes (Signed)
Pt remaining in bathroom - RN requested for mother to check on pt - Mother went in bathroom w/pt then exited. Pt remains in bathroom.

## 2018-11-19 NOTE — ED Provider Notes (Signed)
Medical screening examination/treatment/procedure(s) were conducted as a shared visit with non-physician practitioner(s) and myself.  I personally evaluated the patient during the encounter. Briefly, the patient is a 27 y.o. female with history of IV drug use who presents to the ED with left arm infection.  Patient with normal vitals.  No fever.  Patient with large cellulitis versus abscess in the left AC fossa area.  Patient has called hand but has palpable radial and ulnar pulses.  Neurovascularly she appears intact.  Neuromuscularly she appears intact.  Concern for deep space infection in the left elbow versus septic joint.  She has pain with range of motion at the elbow.  I placed IV.  Patient to be started empirically on IV antibiotics, blood cultures collected.  Orthopedics came down to the ED to evaluate the patient and will take the patient to the OR for washout tonight.  Lab work collected and patient to be admitted to hospitalist for further IV antibiotics.  Hemodynamically stable throughout my care.  Patient awaiting admission.  This chart was dictated using voice recognition software.  Despite best efforts to proofread,  errors can occur which can change the documentation meaning.  EMERGENCY DEPARTMENT  US GUIDANCE EXAM Emergency Ultrasound:  US Guidance for Needle Guidance  INDICATIONS: Difficult vascular access Linear probe used in real-time to visualize location of needle entry through skin.   PERFORMED BY: Myself IMAGES ARCHIVED?: Yes LIMITATIONS: Pain VIEWS USED: Transverse INTERPRETATION: Needle visualized within vein and Left arm   EKG Interpretation None           Virgina NorfolkCuratolo, Denai Caba, DO 11/19/18 1608

## 2018-11-19 NOTE — ED Notes (Signed)
IV team at bedside 

## 2018-11-20 ENCOUNTER — Other Ambulatory Visit: Payer: Self-pay

## 2018-11-20 ENCOUNTER — Encounter (HOSPITAL_COMMUNITY)
Admission: EM | Disposition: A | Discharge status: Home / Self Care | Payer: Self-pay | Source: Home / Self Care | Attending: Internal Medicine

## 2018-11-20 ENCOUNTER — Encounter (HOSPITAL_COMMUNITY): Payer: Self-pay | Admitting: General Practice

## 2018-11-20 DIAGNOSIS — L03114 Cellulitis of left upper limb: Secondary | ICD-10-CM

## 2018-11-20 LAB — BASIC METABOLIC PANEL
Anion gap: 9 (ref 5–15)
BUN: 7 mg/dL (ref 6–20)
CO2: 23 mmol/L (ref 22–32)
Calcium: 8.3 mg/dL — ABNORMAL LOW (ref 8.9–10.3)
Chloride: 104 mmol/L (ref 98–111)
Creatinine, Ser: 0.54 mg/dL (ref 0.44–1.00)
GFR calc Af Amer: >60 mL/min (ref >60)
GFR calc non Af Amer: >60 mL/min (ref >60)
Glucose, Bld: 107 mg/dL — ABNORMAL HIGH (ref 70–99)
Potassium: 4 mmol/L (ref 3.5–5.1)
Sodium: 136 mmol/L (ref 135–145)

## 2018-11-20 LAB — CBC
HCT: 30.3 % — ABNORMAL LOW (ref 36.0–46.0)
Hemoglobin: 9.2 g/dL — ABNORMAL LOW (ref 12.0–15.0)
MCH: 26.4 pg (ref 26.0–34.0)
MCHC: 30.4 g/dL (ref 30.0–36.0)
MCV: 86.8 fL (ref 80.0–100.0)
Platelets: 281 10*3/uL (ref 150–400)
RBC: 3.49 MIL/uL — ABNORMAL LOW (ref 3.87–5.11)
RDW: 15.4 % (ref 11.5–15.5)
WBC: 7.6 10*3/uL (ref 4.0–10.5)
nRBC: 0 % (ref 0.0–0.2)

## 2018-11-20 LAB — MRSA PCR SCREENING: MRSA by PCR: NEGATIVE

## 2018-11-20 SURGERY — IRRIGATION AND DEBRIDEMENT EXTREMITY
Anesthesia: Choice | Laterality: Left

## 2018-11-20 MED ORDER — CHLORHEXIDINE GLUCONATE 4 % EX LIQD
60.0000 mL | Freq: Once | CUTANEOUS | Status: DC
  Filled 2018-11-20: qty 60

## 2018-11-20 MED ORDER — BUPRENORPHINE HCL 2 MG SL SUBL
2.0000 mg | SUBLINGUAL_TABLET | Freq: Every day | SUBLINGUAL | Status: DC
  Administered 2018-11-20 – 2018-11-23 (×4): 2 mg via SUBLINGUAL
  Filled 2018-11-20 (×5): qty 1

## 2018-11-20 MED ORDER — HYDROMORPHONE HCL 1 MG/ML IJ SOLN
2.0000 mg | INTRAMUSCULAR | Status: DC | PRN
  Administered 2018-11-20 (×2): 2 mg via INTRAVENOUS
  Filled 2018-11-20 (×2): qty 2

## 2018-11-20 MED ORDER — BUPRENORPHINE HCL-NALOXONE HCL 2-0.5 MG SL SUBL: 1.0000 | SUBLINGUAL_TABLET | Freq: Every day | SUBLINGUAL | Status: DC

## 2018-11-20 MED ORDER — CLONIDINE HCL 0.1 MG PO TABS
0.1000 mg | ORAL_TABLET | Freq: Every day | ORAL | Status: DC | PRN
  Administered 2018-11-20 – 2018-11-21 (×2): 0.2 mg via ORAL
  Filled 2018-11-20 (×2): qty 2

## 2018-11-20 MED ORDER — FENTANYL CITRATE (PF) 100 MCG/2ML IJ SOLN
50.0000 ug | INTRAMUSCULAR | Status: DC | PRN
  Administered 2018-11-20: 50 ug via INTRAVENOUS
  Filled 2018-11-20: qty 2

## 2018-11-20 MED ORDER — POVIDONE-IODINE 10 % EX SWAB: 2.0000 "application " | Freq: Once | CUTANEOUS | Status: DC
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
## 2018-11-20 NOTE — ED Notes (Signed)
Pt ambulatory to the restroom independently. Mother at bedside

## 2018-11-20 NOTE — Progress Notes (Signed)
Nicole [name], pharmacy tech, called to advise nothing since 2018 has been reported on patient's narcotic report.

## 2018-11-20 NOTE — Progress Notes (Signed)
Patient's mother reports that patient was given prescription for Buprenorphine 8mg  SL BID, prescribed by Dr. Juliene Pinahalla Venkata.  She and her boyfriend were each treated and given a prescription.  However, due to the cost of the medication they were only able to get one prescription filled and have been sharing the medication.

## 2018-11-20 NOTE — Progress Notes (Signed)
Patient having profuse sweating and c/o hurting all over from withdrawal symptoms.  Dr. Blake DivineAkula notified of symptoms.

## 2018-11-20 NOTE — Progress Notes (Signed)
PROGRESS NOTE    Nicole BachWhitney Polak  WUJ:[id number] DOB: Jul 16, 1991 DOA: 11/19/2018 PCP: Patient, No Pcp Per    Brief Narrative:Nicole [name] is a 27 y.o. female with medical history significant for IVDA (heroin) who presented to the ED today with c/o left arm pain, redness and swelling.  She states that on Saturday she was putting her purse on her left arm and noticed that her left arm had become swollen around the elbow.  She developed increased swelling and redness on Sunday and Monday as well as pain.  She states that sometime during the night last night the wound opened up and drained bloody and purulent discharge. 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  Assessment & Plan:   Principal Problem:   Septic arthritis of elbow, left (HCC) Active Problems:   Polysubstance abuse (HCC)   Tachycardia   Cellulitis possibly septic arthritis of the left elbow Patient has extensive edema erythema of the left arm more near the left elbow.  CT of the elbow does not show any abscess at this time.  Dr. Jena GaussHaddix from orthopedics saw the patient recommended continue with IV antibiotics at this time.  Will follow blood cultures. Pain control.   History of IV drug abuse  Patient reports that her last heroin use was about 4 weeks ago. She is on IV pain medications at this time.  Anemia of chronic disease Anemia panel will be ordered.   History of substance abuse Her last UDS was positive for THC, opiates and amphetamines.  Will need social worker consult for resources.   DVT prophylaxis: Lovenox Code Status: Full code Family Communication: None at bedside at this time  disposition Plan: Pending clinical improvement and orthopedic evaluation.   Consultants:   Orthopedics Dr. Jena GaussHaddix  Procedures: None Antimicrobials: IV vancomycin since admission  Subjective: Patient teary and crying about events happened earlier this morning.  She reports left elbow  pain.  Objective: Vitals:   11/19/18 2228 11/20/18 0507 11/20/18 0643 11/20/18 0915  BP: 111/77 128/74 (!) 128/91 130/82  Pulse: 81 (!) 103 86 92  Resp: 18 18    Temp:  97.8 F (36.6 C)  98.3 F (36.8 C)  TempSrc:  Oral  Oral  SpO2: 98% 100% 100%   Weight:      Height:       No intake or output data in the 24 hours ending 11/20/18 1047 Filed Weights   11/19/18 1350  Weight: 59 kg    Examination:  General exam: Mild to moderate distress from pain in the left elbow Respiratory system: Clear to auscultation. Respiratory effort normal. Cardiovascular system: S1 & S2 heard, RRR. No JVD, murmurs, rubs, gallops or clicks. No pedal edema. Gastrointestinal system: Abdomen is nondistended, soft and nontender. No organomegaly or masses felt. Normal bowel sounds heard. Central nervous system: Alert and oriented. No focal neurological deficits. Extremities: Left elbow swelling and redness, left antecubital wound with pus. Skin: See above Psychiatry: Anxious and crying    Data Reviewed: I have personally reviewed following labs and imaging studies  CBC: Recent Labs  Lab 11/19/18 1532 11/20/18 0246  WBC 9.0 7.6  NEUTROABS 6.1  --   HGB 11.3* 9.2*  HCT 35.8* 30.3*  MCV 85.4 86.8  PLT 343 281   Basic Metabolic Panel: Recent Labs  Lab 11/19/18 1532 11/20/18 0246  NA 135 136  K 4.0 4.0  CL 98 104  CO2 28 23  GLUCOSE 117* 107*  BUN 7 7  CREATININE  0.75 0.54  CALCIUM 9.2 8.3*   GFR: Estimated Creatinine Clearance: 98.4 mL/min (by C-G formula based on SCr of 0.54 mg/dL). Liver Function Tests: Recent Labs  Lab 11/19/18 1532  AST 46*  ALT 43  ALKPHOS 119  BILITOT 0.9  PROT 8.1  ALBUMIN 3.6   No results for input(s): LIPASE, AMYLASE in the last 168 hours. No results for input(s): AMMONIA in the last 168 hours. Coagulation Profile: No results for input(s): INR, PROTIME in the last 168 hours. Cardiac Enzymes: No results for input(s): CKTOTAL, CKMB, CKMBINDEX,  TROPONINI in the last 168 hours. BNP (last 3 results) No results for input(s): PROBNP in the last 8760 hours. HbA1C: No results for input(s): HGBA1C in the last 72 hours. CBG: No results for input(s): GLUCAP in the last 168 hours. Lipid Profile: No results for input(s): CHOL, HDL, LDLCALC, TRIG, CHOLHDL, LDLDIRECT in the last 72 hours. Thyroid Function Tests: No results for input(s): TSH, T4TOTAL, FREET4, T3FREE, THYROIDAB in the last 72 hours. Anemia Panel: No results for input(s): VITAMINB12, FOLATE, FERRITIN, TIBC, IRON, RETICCTPCT in the last 72 hours. Sepsis Labs: No results for input(s): PROCALCITON, LATICACIDVEN in the last 168 hours.  Recent Results (from the past 240 hour(s))  Blood culture (routine x 2)     Status: None (Preliminary result)   Collection Time: 11/19/18  3:30 PM  Result Value Ref Range Status   Specimen Description BLOOD RIGHT ANTECUBITAL  Final   Special Requests   Final    BOTTLES DRAWN AEROBIC AND ANAEROBIC Blood Culture results may not be optimal due to an inadequate volume of blood received in culture bottles   Culture   Final    NO GROWTH < 24 HOURS Performed at [facility], [address] [place], [place] [address]    Report Status PENDING  Incomplete  Blood culture (routine x 2)     Status: None (Preliminary result)   Collection Time: 11/19/18  4:15 PM  Result Value Ref Range Status   Specimen Description BLOOD RIGHT ARM  Final   Special Requests   Final    BOTTLES DRAWN AEROBIC AND ANAEROBIC Blood Culture adequate volume   Culture   Final    NO GROWTH < 24 HOURS Performed at [facility], [address], [place], [place] [address]    Report Status PENDING  Incomplete         Radiology Studies: Ct Angio Up Extrem Left W &/or Wo Contast  Result Date: 11/19/2018 CLINICAL DATA:  Left arm swelling, pain, drainage. EXAM: CT ANGIOGRAPHY OF THE LEFT UPPEREXTREMITY TECHNIQUE: Multidetector CT imaging of the left upper extremitywas  performed using the standard protocol during bolus administration of intravenous contrast. Multiplanar CT image reconstructions and MIPs were obtained to evaluate the vascular anatomy. CONTRAST:  ISOVUE-370 IOPAMIDOL (ISOVUE-370) INJECTION 76% COMPARISON:  None. FINDINGS: Left upper extremity arterial system is widely patent. Stranding noted in the subcutaneous soft tissues of the left upper extremity, most notable in the antecubital fossa region, likely cellulitis. Edema also appears to involve the biceps muscle and could reflect myositis. I see no drainable fluid collection. No acute bony abnormality. Mild paraseptal emphysema changes noted in the left lung. No confluent opacities or effusions. Heart is normal size. Review of the MIP images confirms the above findings. IMPRESSION: Left upper extremity arterial system widely patent. Stranding in the subcutaneous soft tissues of the left arm by most pronounced in the antecubital fossa region compatible with cellulitis. This extends to  and possibly involves the biceps muscle which may reflect myositis. No drainable fluid. Electronically Signed   By: Charlett Nose M.D.   On: 11/19/2018 20:36        Scheduled Meds: . chlorhexidine  60 mL Topical Once  . enoxaparin (LOVENOX) injection  40 mg Subcutaneous Q24H  . nicotine  21 mg Transdermal Once  . povidone-iodine  2 application Topical Once   Continuous Infusions: . sodium chloride 75 mL/hr at 11/20/18 0944  . vancomycin 1,000 mg (11/20/18 0556)     LOS: 1 day    Time spent: 35 minutes    Kathlen Mody, MD [facility] Pager [phone]  If 7PM-7AM, please contact night-coverage [email or web address] Password [email or web address] 11/20/2018, 10:47 AM

## 2018-11-20 NOTE — ED Notes (Signed)
Pt requesting pain med orders prior to going upstairs, hospitalist paged.

## 2018-11-20 NOTE — Progress Notes (Signed)
Orthopaedic Trauma Progress Note:  Patient seen and examined this morning.  States that she is unsure if her pain has improved her redness has improved.  States that the swelling has been there since late last week.  Notes the drainage the night before last.  She underwent CT scan yesterday evening.  Has been on vancomycin since admission.  Denies any numbness or tingling.  Unable to move her arm without significant discomfort or pain.  Physical exam: Shows significant erythema and swelling about the elbow.  Unable to move or bend her elbow secondary to pain.  There is a small pustule at the location of her antecubital fossa.  There is some hematoma and dried purulent material however nothing was able to be expressed.  She is motor and sensory function distally.  Imaging CT scan is reviewed.  No discernible abscess collection shows just significant stranding in the soft tissues and subcutaneous tissue with some changes in the biceps muscle but no focal fluid collection.  Plan: Due to the negative CT scan for definitive abscess I do not feel that surgical intervention is warranted at this time.  I feel that a course of nonoperative treatment with IV antibiotics to see whether or not the cellulitis will improve.  In my opinion without a discernible collection on the scan I do not feel that an incision and drainage would provide a significant benefit.  If patient does not improve with IV antibiotics may have to reevaluate the possible need for I&D.  Patient is not septic and she does not have a elevated white count.  Continue with observation.  Patient may have a diet today we will likely place n.p.o. after midnight in case of worsening.  We will reevaluate later this afternoon.  Roby LoftsKevin P. Haddix, MD [facility] [phone] (phone)

## 2018-11-20 NOTE — ED Notes (Addendum)
Pt alert and oriented x4. CC of Left arm pain and swelling. L elbow edema/2+ pitting. Area is redden. Seroserous/purelent moderate amount of drainage from site.  Radial pulse and branchial pulse 2+ on left upper extremity. Pt resting, family at bedside.

## 2018-11-21 LAB — BASIC METABOLIC PANEL
ANION GAP: 9 (ref 5–15)
BUN: 6 mg/dL (ref 6–20)
CALCIUM: 9 mg/dL (ref 8.9–10.3)
CO2: 25 mmol/L (ref 22–32)
Chloride: 104 mmol/L (ref 98–111)
Creatinine, Ser: 0.64 mg/dL (ref 0.44–1.00)
GFR calc non Af Amer: >60 mL/min (ref >60)
Glucose, Bld: 106 mg/dL — ABNORMAL HIGH (ref 70–99)
Potassium: 4.2 mmol/L (ref 3.5–5.1)
Sodium: 138 mmol/L (ref 135–145)

## 2018-11-21 LAB — IRON AND TIBC
IRON: 39 ug/dL (ref 28–170)
Saturation Ratios: 14 % (ref 10.4–31.8)
TIBC: 276 ug/dL (ref 250–450)
UIBC: 237 ug/dL

## 2018-11-21 LAB — VITAMIN B12: Vitamin B-12: 533 pg/mL (ref 180–914)

## 2018-11-21 LAB — CBC
HCT: 33.6 % — ABNORMAL LOW (ref 36.0–46.0)
Hemoglobin: 10.5 g/dL — ABNORMAL LOW (ref 12.0–15.0)
MCH: 26.6 pg (ref 26.0–34.0)
MCHC: 31.3 g/dL (ref 30.0–36.0)
MCV: 85.3 fL (ref 80.0–100.0)
NRBC: 0 % (ref 0.0–0.2)
Platelets: 345 10*3/uL (ref 150–400)
RBC: 3.94 MIL/uL (ref 3.87–5.11)
RDW: 15.1 % (ref 11.5–15.5)
WBC: 4 10*3/uL (ref 4.0–10.5)

## 2018-11-21 LAB — FOLATE: Folate: 13.6 ng/mL (ref >5.9)

## 2018-11-21 LAB — FERRITIN: Ferritin: 68 ng/mL (ref 11–307)

## 2018-11-21 MED ORDER — NICOTINE 14 MG/24HR TD PT24
14.0000 mg | MEDICATED_PATCH | Freq: Every day | TRANSDERMAL | Status: DC
  Administered 2018-11-21 – 2018-11-23 (×3): 14 mg via TRANSDERMAL
  Filled 2018-11-21 (×3): qty 1

## 2018-11-21 NOTE — Progress Notes (Signed)
Palliative Medicine RN Note: Consult order noted and discussed with PMT providers.  Palliative medicine supports patients who have symptoms related to terminal diagnoses. While Ms Nicole [name] has an unfortunate addiction, no terminal diagnosis is noted in her chart, so there is no role for PMT at this time.  PMT recommends referral to an addiction specialist, and if pain is uncontrolled, a referral to pain management.   Referral will be cancelled. Please call us if new needs arise.  Margret ChanceMelanie G. Sarahy Creedon, RN, BSN, [facility] Palliative Medicine Team 11/21/2018 9:32 AM Office [phone]

## 2018-11-21 NOTE — Progress Notes (Signed)
Orthopaedic Trauma Progress Note  S: Still in a lot of pain. Sleeping this AM and not fully cooperative with exam  O:  Vitals:   11/20/18 1623 11/20/18 2100  BP: 122/66 128/87  Pulse: 83   Resp:    Temp: 98.6 F (37 C) 98.6 F (37 C)  SpO2:      LUE: Area of drainage still present, erythema and swelling improved. Short arm range of motion of elbow much more tolerable this AM. No worsening of erythema  Imaging: No new imaging  Labs:  Results for orders placed or performed during the hospital encounter of 11/19/18 (from the past 24 hour(s))  CBC     Status: Abnormal   Collection Time: 11/21/18  5:13 AM  Result Value Ref Range   WBC 4.0 4.0 - 10.5 K/uL   RBC 3.94 3.87 - 5.11 MIL/uL   Hemoglobin 10.5 (L) 12.0 - 15.0 g/dL   HCT 16.133.6 (L) 09.636.0 - 04.546.0 %   MCV 85.3 80.0 - 100.0 fL   MCH 26.6 26.0 - 34.0 pg   MCHC 31.3 30.0 - 36.0 g/dL   RDW 40.915.1 81.111.5 - 91.415.5 %   Platelets 345 150 - 400 K/uL   nRBC 0.0 0.0 - 0.2 %  Basic metabolic panel     Status: Abnormal   Collection Time: 11/21/18  5:13 AM  Result Value Ref Range   Sodium 138 135 - 145 mmol/L   Potassium 4.2 3.5 - 5.1 mmol/L   Chloride 104 98 - 111 mmol/L   CO2 25 22 - 32 mmol/L   Glucose, Bld 106 (H) 70 - 99 mg/dL   BUN 6 6 - 20 mg/dL   Creatinine, Ser 7.820.64 0.44 - 1.00 mg/dL   Calcium 9.0 8.9 - 95.610.3 mg/dL   GFR calc non Af Amer >60 >60 mL/min   GFR calc Af Amer >60 >60 mL/min   Anion gap 9 5 - 15  Vitamin B12     Status: None   Collection Time: 11/21/18  5:13 AM  Result Value Ref Range   Vitamin B-12 533 180 - 914 pg/mL  Folate     Status: None   Collection Time: 11/21/18  5:13 AM  Result Value Ref Range   Folate 13.6 >5.9 ng/mL  Iron and TIBC     Status: None   Collection Time: 11/21/18  5:13 AM  Result Value Ref Range   Iron 39 28 - 170 ug/dL   TIBC 213276 086250 - 578450 ug/dL   Saturation Ratios 14 10.4 - 31.8 %   UIBC 237 ug/dL  Ferritin     Status: None   Collection Time: 11/21/18  5:13 AM  Result Value Ref  Range   Ferritin 68 11 - 307 ng/mL    Assessment: 27 year old female with AC cellulitis and spontaneous drainage  Seems to be clinically improving with spontaneous drainage and no abscess on CT do not feel a role for operative intervention. Will continue to follow. Will likely need continued IV antibiotics  CV/Blood loss:Hgb 10.5, hemodynamically stable  Pain management: Per hospitalist, started on Subutex for opiod withdrawal  VTE prophylaxis: Lovenox 40 mg  ID: Vancomycin IV 1000mg  q 12 hours  Foley/Lines: None  Medical co-morbidities: Opoid abuse history  Dispo: TBD  Roby LoftsKevin P. Abhi Moccia, MD [facility] [phone] (phone)

## 2018-11-21 NOTE — Progress Notes (Signed)
PROGRESS NOTE    Nicole [name]  WUJ:[id number] DOB: Dec 24, 1990 DOA: 11/19/2018 PCP: Patient, No Pcp Per    Brief Narrative:Nicole [name] is a 27 y.o. female with medical history significant for IVDA (heroin) who presented to the ED today with c/o left arm pain, redness and swelling.  She states that on Saturday she was putting her purse on her left arm and noticed that her left arm had become swollen around the elbow.  She developed increased swelling and redness on Sunday and Monday as well as pain.  She states that sometime during the night last night the wound opened up and drained bloody and purulent discharge. 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  Assessment & Plan:   Principal Problem:   Septic arthritis of elbow, left (HCC) Active Problems:   Polysubstance abuse (HCC)   Tachycardia   Cellulitis possibly septic arthritis of the left elbow Patient has extensive edema and erythema of the left arm more near the left elbow.,  Appears to be improving CT of the elbow does not show any abscess at this time.  Dr. Jena Gauss from orthopedics saw the patient recommended continue with IV antibiotics at this time.  Will follow blood cultures.  Cultures have been negative so far. Pain control.  Recommend continuing broad-spectrum IV antibiotics for another 48 hours.  History of IV drug abuse/opioid abuse Patient reports that her last heroin use was about 4 weeks ago.  Reports that she was given a prescription for buprenorphine sublingual and has been using it since 1 week. We have restarted the buprenorphine sublingual 2 mg daily. Clonidine added for withdrawal symptoms.  Anemia of chronic disease hemoglobin stable at 10.5.  Anemia panel shows adequate iron and ferritin levels.    History of substance abuse Her last UDS was positive for THC, opiates and amphetamines.  Will need social worker consult for resources.   DVT prophylaxis: Lovenox Code Status: Full  code Family Communication: None at bedside at this time  disposition Plan: Pending clinical improvement and orthopedic evaluation.   Consultants:   Orthopedics Dr. Jena Gauss  Procedures: None Antimicrobials: IV vancomycin since admission  Subjective: Reports sweating, associated with some chills and persistent pain in the left antecubital fossa.  Objective: Vitals:   11/20/18 1623 11/20/18 2100 11/21/18 0832 11/21/18 1205  BP: 122/66 128/87 132/90 (!) 150/86  Pulse: 83   70  Resp:      Temp: 98.6 F (37 C) 98.6 F (37 C) 98.4 F (36.9 C) 98.5 F (36.9 C)  TempSrc: Oral Oral Oral Oral  SpO2:      Weight:      Height:        Intake/Output Summary (Last 24 hours) at 11/21/2018 1502 Last data filed at 11/21/2018 0800 Gross per 24 hour  Intake 1289.1 ml  Output -  Net 1289.1 ml   Filed Weights   11/19/18 1350  Weight: 59 kg    Examination:  General exam: Mild distress from pain in the left antecubital fossa Respiratory system: Clear to auscultation. Respiratory effort normal.  No wheezing or rhonchi Cardiovascular system: S1 & S2 heard, RRR. No JVD, Gastrointestinal system: Abdomen is soft, nontender, nondistended with good bowel sounds. Central nervous system: Alert and oriented. No focal neurological deficits. Extremities: Left elbow and forearm swelling and erythema is improving.  But she continues to have painful range of movements Skin: See above Psychiatry: Continues to be anxious.    Data Reviewed: I have personally reviewed following labs and  imaging studies  CBC: Recent Labs  Lab 11/19/18 1532 11/20/18 0246 11/21/18 0513  WBC 9.0 7.6 4.0  NEUTROABS 6.1  --   --   HGB 11.3* 9.2* 10.5*  HCT 35.8* 30.3* 33.6*  MCV 85.4 86.8 85.3  PLT 343 281 345   Basic Metabolic Panel: Recent Labs  Lab 11/19/18 1532 11/20/18 0246 11/21/18 0513  NA 135 136 138  K 4.0 4.0 4.2  CL 98 104 104  CO2 28 23 25   GLUCOSE 117* 107* 106*  BUN 7 7 6   CREATININE 0.75  0.54 0.64  CALCIUM 9.2 8.3* 9.0   GFR: Estimated Creatinine Clearance: 98.4 mL/min (by C-G formula based on SCr of 0.64 mg/dL). Liver Function Tests: Recent Labs  Lab 11/19/18 1532  AST 46*  ALT 43  ALKPHOS 119  BILITOT 0.9  PROT 8.1  ALBUMIN 3.6   No results for input(s): LIPASE, AMYLASE in the last 168 hours. No results for input(s): AMMONIA in the last 168 hours. Coagulation Profile: No results for input(s): INR, PROTIME in the last 168 hours. Cardiac Enzymes: No results for input(s): CKTOTAL, CKMB, CKMBINDEX, TROPONINI in the last 168 hours. BNP (last 3 results) No results for input(s): PROBNP in the last 8760 hours. HbA1C: No results for input(s): HGBA1C in the last 72 hours. CBG: No results for input(s): GLUCAP in the last 168 hours. Lipid Profile: No results for input(s): CHOL, HDL, LDLCALC, TRIG, CHOLHDL, LDLDIRECT in the last 72 hours. Thyroid Function Tests: No results for input(s): TSH, T4TOTAL, FREET4, T3FREE, THYROIDAB in the last 72 hours. Anemia Panel: Recent Labs    11/21/18 0513  VITAMINB12 533  FOLATE 13.6  FERRITIN 68  TIBC 276  IRON 39   Sepsis Labs: No results for input(s): PROCALCITON, LATICACIDVEN in the last 168 hours.  Recent Results (from the past 240 hour(s))  Blood culture (routine x 2)     Status: None (Preliminary result)   Collection Time: 11/19/18  3:30 PM  Result Value Ref Range Status   Specimen Description BLOOD RIGHT ANTECUBITAL  Final   Special Requests   Final    BOTTLES DRAWN AEROBIC AND ANAEROBIC Blood Culture results may not be optimal due to an inadequate volume of blood received in culture bottles   Culture   Final    NO GROWTH 2 DAYS Performed at [facility], [address] [place], [place] [address]    Report Status PENDING  Incomplete  Blood culture (routine x 2)     Status: None (Preliminary result)   Collection Time: 11/19/18  4:15 PM  Result Value Ref Range Status   Specimen Description BLOOD RIGHT  ARM  Final   Special Requests   Final    BOTTLES DRAWN AEROBIC AND ANAEROBIC Blood Culture adequate volume   Culture   Final    NO GROWTH 2 DAYS Performed at [facility], [address] [place], [place] [address]    Report Status PENDING  Incomplete  MRSA PCR Screening     Status: None   Collection Time: 11/20/18  6:08 AM  Result Value Ref Range Status   MRSA by PCR NEGATIVE NEGATIVE Final    Comment:        The GeneXpert MRSA Assay (FDA approved for NASAL specimens only), is one component of a comprehensive MRSA colonization surveillance program. It is not intended to diagnose MRSA infection nor to guide or monitor treatment for MRSA infections. Performed at [facility], [address], [place], [place] [address]          Radiology Studies: Ct Angio Up Extrem Left W &/or Wo Contast  Result Date: 11/19/2018 CLINICAL DATA:  Left arm swelling, pain, drainage. EXAM: CT ANGIOGRAPHY OF THE LEFT UPPEREXTREMITY TECHNIQUE: Multidetector CT imaging of the left upper extremitywas performed using the standard protocol during bolus administration of intravenous contrast. Multiplanar CT image reconstructions and MIPs were obtained to evaluate the vascular anatomy. CONTRAST:  ISOVUE-370 IOPAMIDOL (ISOVUE-370) INJECTION 76% COMPARISON:  None. FINDINGS: Left upper extremity arterial system is widely patent. Stranding noted in the subcutaneous soft tissues of the left upper extremity, most notable in the antecubital fossa region, likely cellulitis. Edema also appears to involve the biceps muscle and could reflect myositis. I see no drainable fluid collection. No acute bony abnormality. Mild paraseptal emphysema changes noted in the left lung. No confluent opacities or effusions. Heart is normal size. Review of the MIP images confirms the above findings. IMPRESSION: Left upper extremity arterial system widely patent. Stranding in the subcutaneous soft tissues of the left arm by  most pronounced in the antecubital fossa region compatible with cellulitis. This extends to and possibly involves the biceps muscle which may reflect myositis. No drainable fluid. Electronically Signed   By: Charlett Nose M.D.   On: 11/19/2018 20:36        Scheduled Meds: . buprenorphine  2 mg Sublingual Daily  . chlorhexidine  60 mL Topical Once  . enoxaparin (LOVENOX) injection  40 mg Subcutaneous Q24H  . nicotine  21 mg Transdermal Once  . povidone-iodine  2 application Topical Once   Continuous Infusions: . sodium chloride 75 mL/hr at 11/21/18 0800  . vancomycin Stopped (11/21/18 6045)     LOS: 2 days    Time spent: 35 minutes    Kathlen Mody, MD [facility] Pager [phone]  If 7PM-7AM, please contact night-coverage [email or web address] Password [email or web address] 11/21/2018, 3:02 PM

## 2018-11-22 LAB — BASIC METABOLIC PANEL
Anion gap: 9 (ref 5–15)
BUN: 9 mg/dL (ref 6–20)
CHLORIDE: 105 mmol/L (ref 98–111)
CO2: 25 mmol/L (ref 22–32)
Calcium: 9.4 mg/dL (ref 8.9–10.3)
Creatinine, Ser: 0.72 mg/dL (ref 0.44–1.00)
GFR calc Af Amer: >60 mL/min (ref >60)
GFR calc non Af Amer: >60 mL/min (ref >60)
Glucose, Bld: 113 mg/dL — ABNORMAL HIGH (ref 70–99)
Potassium: 4.5 mmol/L (ref 3.5–5.1)
Sodium: 139 mmol/L (ref 135–145)

## 2018-11-22 LAB — CBC
HEMATOCRIT: 36.9 % (ref 36.0–46.0)
Hemoglobin: 11.6 g/dL — ABNORMAL LOW (ref 12.0–15.0)
MCH: 26.3 pg (ref 26.0–34.0)
MCHC: 31.4 g/dL (ref 30.0–36.0)
MCV: 83.7 fL (ref 80.0–100.0)
Platelets: 403 10*3/uL — ABNORMAL HIGH (ref 150–400)
RBC: 4.41 MIL/uL (ref 3.87–5.11)
RDW: 14.9 % (ref 11.5–15.5)
WBC: 5.8 10*3/uL (ref 4.0–10.5)
nRBC: 0 % (ref 0.0–0.2)

## 2018-11-22 MED ORDER — IBUPROFEN 200 MG PO TABS
400.0000 mg | ORAL_TABLET | Freq: Three times a day (TID) | ORAL | Status: DC | PRN
  Administered 2018-11-22: 400 mg via ORAL
  Filled 2018-11-22: qty 2

## 2018-11-22 NOTE — Progress Notes (Signed)
Orthopaedic Trauma Progress Note  S: Pain much better. Able to move arm. Asking about going home  O:  Vitals:   11/22/18 0732 11/22/18 1208  BP: 110/75 118/79  Pulse: 74 85  Resp:    Temp: 98.1 F (36.7 C) 97.8 F (36.6 C)  SpO2:      LUE: Area of drainage still present but improved. Erythema and swelling much improved. Able to perform nearly 75 degrees of flexion  Imaging: No new imaging  Labs:  Results for orders placed or performed during the hospital encounter of 11/19/18 (from the past 24 hour(s))  CBC     Status: Abnormal   Collection Time: 11/22/18  3:53 AM  Result Value Ref Range   WBC 5.8 4.0 - 10.5 K/uL   RBC 4.41 3.87 - 5.11 MIL/uL   Hemoglobin 11.6 (L) 12.0 - 15.0 g/dL   HCT 04.536.9 40.936.0 - 81.146.0 %   MCV 83.7 80.0 - 100.0 fL   MCH 26.3 26.0 - 34.0 pg   MCHC 31.4 30.0 - 36.0 g/dL   RDW 91.414.9 78.211.5 - 95.615.5 %   Platelets 403 (H) 150 - 400 K/uL   nRBC 0.0 0.0 - 0.2 %  Basic metabolic panel     Status: Abnormal   Collection Time: 11/22/18  3:53 AM  Result Value Ref Range   Sodium 139 135 - 145 mmol/L   Potassium 4.5 3.5 - 5.1 mmol/L   Chloride 105 98 - 111 mmol/L   CO2 25 22 - 32 mmol/L   Glucose, Bld 113 (H) 70 - 99 mg/dL   BUN 9 6 - 20 mg/dL   Creatinine, Ser 2.130.72 0.44 - 1.00 mg/dL   Calcium 9.4 8.9 - 08.610.3 mg/dL   GFR calc non Af Amer >60 >60 mL/min   GFR calc Af Amer >60 >60 mL/min   Anion gap 9 5 - 15    Assessment: 27 year old female with AC cellulitis and spontaneous drainage  Much improved, no OR needed  CV/Blood loss:hemodynamically stable  Pain management: Per hospitalist, started on Subutex for opiod withdrawal  VTE prophylaxis: Lovenox 40 mg  ID: Vancomycin IV 1000mg  q 12 hours Transition to clindamycin PO today with likely discharge tomorrow  Foley/Lines: None  Medical co-morbidities: Opoid abuse history  Dispo: TBD  Roby LoftsKevin P. Haddix, MD [facility] [phone] (phone)

## 2018-11-22 NOTE — Progress Notes (Signed)
PROGRESS NOTE    Nicole [name]  ZOX:[id number] DOB: 02-25-1991 DOA: 11/19/2018 PCP: Patient, No Pcp Per    Brief Narrative:Nicole [name] is a 27 y.o. female with medical history significant for IVDA (heroin) who presented to the ED today with c/o left arm pain, redness and swelling.  She states that on Saturday she was putting her purse on her left arm and noticed that her left arm had become swollen around the elbow.  She developed increased swelling and redness on Sunday and Monday as well as pain.  She states that sometime during the night last night the wound opened up and drained bloody and purulent discharge. 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  Assessment & Plan:   Principal Problem:   Septic arthritis of elbow, left (HCC) Active Problems:   Polysubstance abuse (HCC)   Tachycardia   Cellulitis possibly septic arthritis of the left elbow Patient has extensive edema and erythema of the left arm more near the left elbow, swelling and erythema has improved and she is able to have better range of movement at the elbow.  CT of the elbow does not show any abscess at this time.  Dr. Jena Gauss from orthopedics saw the patient recommended continue with IV antibiotics at this time.  No role for operative intervention at this time.  Will follow blood cultures.  Cultures have been negative so far. Pain control.  Recommend continuing broad-spectrum IV antibiotics for another 48 24 hours.   History of IV drug abuse/opioid abuse Patient reports that her last heroin use was about 4 weeks ago.  Reports that she was given a prescription for buprenorphine sublingual and has been using it since 1 week. We have restarted the buprenorphine sublingual 2 mg daily. Clonidine added for withdrawal symptoms.  Anemia of chronic disease hemoglobin stable at around 10   History of substance abuse Her last UDS was positive for THC, opiates and amphetamines.  Will need social worker consult  for resources.   DVT prophylaxis: Lovenox Code Status: Full code Family Communication: Family at bedside  disposition Plan: Pending clinical improvement and orthopedic evaluation.   Consultants:   Orthopedics Dr. Jena Gauss  Procedures: None Antimicrobials: IV vancomycin since admission  Subjective: She appears calm today answering questions appropriately and able to move her left arm. She reports persistent pain at the elbow. Objective: Vitals:   11/21/18 2000 11/22/18 0000 11/22/18 0400 11/22/18 0732  BP: 125/90  126/85 110/75  Pulse:    74  Resp:      Temp: 98.1 F (36.7 C) 98.1 F (36.7 C) 97.6 F (36.4 C) 98.1 F (36.7 C)  TempSrc: Oral Oral Oral Oral  SpO2:      Weight:      Height:        Intake/Output Summary (Last 24 hours) at 11/22/2018 1135 Last data filed at 11/22/2018 0300 Gross per 24 hour  Intake 880 ml  Output -  Net 880 ml   Filed Weights   11/19/18 1350  Weight: 59 kg    Examination:  General exam: Calm and comfortable Respiratory system: Clear to auscultation bilaterally Cardiovascular system: S1 & S2 heard, RRR. No JVD, Gastrointestinal system: Abdomen is soft, nontender, nondistended, bowel sounds are good Central nervous system: Alert and oriented. No focal neurological deficits. Extremities: Left elbow and forearm swelling and erythema is improving.  Wincing on moving the left elbow. Skin: See above Psychiatry: Anxiety better than yesterday    Data Reviewed: I have personally reviewed following  labs and imaging studies  CBC: Recent Labs  Lab 11/19/18 1532 11/20/18 0246 11/21/18 0513 11/22/18 0353  WBC 9.0 7.6 4.0 5.8  NEUTROABS 6.1  --   --   --   HGB 11.3* 9.2* 10.5* 11.6*  HCT 35.8* 30.3* 33.6* 36.9  MCV 85.4 86.8 85.3 83.7  PLT 343 281 345 403*   Basic Metabolic Panel: Recent Labs  Lab 11/19/18 1532 11/20/18 0246 11/21/18 0513 11/22/18 0353  NA 135 136 138 139  K 4.0 4.0 4.2 4.5  CL 98 104 104 105  CO2 28 23 25  25   GLUCOSE 117* 107* 106* 113*  BUN 7 7 6 9   CREATININE 0.75 0.54 0.64 0.72  CALCIUM 9.2 8.3* 9.0 9.4   GFR: Estimated Creatinine Clearance: 98.4 mL/min (by C-G formula based on SCr of 0.72 mg/dL). Liver Function Tests: Recent Labs  Lab 11/19/18 1532  AST 46*  ALT 43  ALKPHOS 119  BILITOT 0.9  PROT 8.1  ALBUMIN 3.6   No results for input(s): LIPASE, AMYLASE in the last 168 hours. No results for input(s): AMMONIA in the last 168 hours. Coagulation Profile: No results for input(s): INR, PROTIME in the last 168 hours. Cardiac Enzymes: No results for input(s): CKTOTAL, CKMB, CKMBINDEX, TROPONINI in the last 168 hours. BNP (last 3 results) No results for input(s): PROBNP in the last 8760 hours. HbA1C: No results for input(s): HGBA1C in the last 72 hours. CBG: No results for input(s): GLUCAP in the last 168 hours. Lipid Profile: No results for input(s): CHOL, HDL, LDLCALC, TRIG, CHOLHDL, LDLDIRECT in the last 72 hours. Thyroid Function Tests: No results for input(s): TSH, T4TOTAL, FREET4, T3FREE, THYROIDAB in the last 72 hours. Anemia Panel: Recent Labs    11/21/18 0513  VITAMINB12 533  FOLATE 13.6  FERRITIN 68  TIBC 276  IRON 39   Sepsis Labs: No results for input(s): PROCALCITON, LATICACIDVEN in the last 168 hours.  Recent Results (from the past 240 hour(s))  Blood culture (routine x 2)     Status: None (Preliminary result)   Collection Time: 11/19/18  3:30 PM  Result Value Ref Range Status   Specimen Description BLOOD RIGHT ANTECUBITAL  Final   Special Requests   Final    BOTTLES DRAWN AEROBIC AND ANAEROBIC Blood Culture results may not be optimal due to an inadequate volume of blood received in culture bottles   Culture   Final    NO GROWTH 3 DAYS Performed at [facility], [address] [place], [place] [address]    Report Status PENDING  Incomplete  Blood culture (routine x 2)     Status: None (Preliminary result)   Collection Time: 11/19/18  4:15  PM  Result Value Ref Range Status   Specimen Description BLOOD RIGHT ARM  Final   Special Requests   Final    BOTTLES DRAWN AEROBIC AND ANAEROBIC Blood Culture adequate volume   Culture   Final    NO GROWTH 3 DAYS Performed at [facility], [address] [place], [place] [address]    Report Status PENDING  Incomplete  MRSA PCR Screening     Status: None   Collection Time: 11/20/18  6:08 AM  Result Value Ref Range Status   MRSA by PCR NEGATIVE NEGATIVE Final    Comment:        The GeneXpert MRSA Assay (FDA approved for NASAL specimens only), is one component of a comprehensive MRSA colonization surveillance program. It is not intended  to diagnose MRSA infection nor to guide or monitor treatment for MRSA infections. Performed at [facility], [address] [place], [place] [address]          Radiology Studies: No results found.      Scheduled Meds: . buprenorphine  2 mg Sublingual Daily  . chlorhexidine  60 mL Topical Once  . enoxaparin (LOVENOX) injection  40 mg Subcutaneous Q24H  . nicotine  14 mg Transdermal Daily  . povidone-iodine  2 application Topical Once   Continuous Infusions: . sodium chloride 75 mL/hr at 11/22/18 0551  . vancomycin 1,000 mg (11/22/18 0554)     LOS: 3 days    Time spent: 30 minutes    Kathlen Mody, MD [facility] Pager [phone]  If 7PM-7AM, please contact night-coverage [email or web address] Password [email or web address] 11/22/2018, 11:35 AM

## 2018-11-22 NOTE — Progress Notes (Signed)
Pharmacy Antibiotic Note  Nicole BachWhitney [name] is a 27 y.o. female admitted on 11/19/2018 with cellulitis.  Pharmacy has been consulted for vancomycin dosing. She presents with swelling and redness on left arm. Ortho opted not to I&D. Today is day #4 of therapy. Pt is afebrile and WBC is WNL. SCr is WNL and stable. Blood cultures are negative to date.   Plan: Continue vanc 1gm IV Q12H F/u renal fxn, C&S, clinical status and [facility] - possible DC 12/7 per MD note  Height: 5\' 9"  (175.3 cm) Weight: 130 lb (59 kg) IBW/kg (Calculated) : 66.2  Temp (24hrs), Avg:98.2 F (36.8 C), Min:97.6 F (36.4 C), Max:98.7 F (37.1 C)  Recent Labs  Lab 11/19/18 1532 11/20/18 0246 11/21/18 0513 11/22/18 0353  WBC 9.0 7.6 4.0 5.8  CREATININE 0.75 0.54 0.64 0.72    Estimated Creatinine Clearance: 98.4 mL/min (by C-G formula based on SCr of 0.72 mg/dL).    Allergies  Allergen Reactions  . Hydrocodone Hives and Itching    Antimicrobials this admission: Vanc 12/3 >>   Thank you for allowing pharmacy to be a part of this patient's care.  Lysle Pearlachel Kamran Coker, PharmD, BCPS Please see AMION for all pharmacy numbers 11/22/2018 9:31 AM

## 2018-11-23 DIAGNOSIS — M009 Pyogenic arthritis, unspecified: Secondary | ICD-10-CM

## 2018-11-23 DIAGNOSIS — F1910 Other psychoactive substance abuse, uncomplicated: Secondary | ICD-10-CM

## 2018-11-23 LAB — CBC
HCT: 34.3 % — ABNORMAL LOW (ref 36.0–46.0)
Hemoglobin: 11.3 g/dL — ABNORMAL LOW (ref 12.0–15.0)
MCH: 27 pg (ref 26.0–34.0)
MCHC: 32.9 g/dL (ref 30.0–36.0)
MCV: 82.1 fL (ref 80.0–100.0)
Platelets: 350 10*3/uL (ref 150–400)
RBC: 4.18 MIL/uL (ref 3.87–5.11)
RDW: 14.6 % (ref 11.5–15.5)
WBC: 5.6 10*3/uL (ref 4.0–10.5)
nRBC: 0 % (ref 0.0–0.2)

## 2018-11-23 LAB — BASIC METABOLIC PANEL
Anion gap: 10 (ref 5–15)
BUN: 8 mg/dL (ref 6–20)
CO2: 23 mmol/L (ref 22–32)
Calcium: 9 mg/dL (ref 8.9–10.3)
Chloride: 105 mmol/L (ref 98–111)
Creatinine, Ser: 0.73 mg/dL (ref 0.44–1.00)
GFR calc Af Amer: >60 mL/min (ref >60)
GFR calc non Af Amer: >60 mL/min (ref >60)
Glucose, Bld: 90 mg/dL (ref 70–99)
Potassium: 4.3 mmol/L (ref 3.5–5.1)
Sodium: 138 mmol/L (ref 135–145)

## 2018-11-23 MED ORDER — CLINDAMYCIN HCL 300 MG PO CAPS: 300.0000 mg | ORAL_CAPSULE | Freq: Three times a day (TID) | ORAL | 0 refills | Status: AC

## 2018-11-23 NOTE — Care Management (Signed)
Information placed on AVS.

## 2018-11-23 NOTE — Progress Notes (Signed)
Nsg Discharge Note  Admit Date:  11/19/2018 Discharge date: 11/23/2018   Alfredo BachWhitney Kempfer to be D/C'd home per MD order.  AVS completed.  Patient/caregiver able to verbalize understanding.  Discharge Medication: Allergies as of 11/23/2018      Reactions   Hydrocodone Hives, Itching      Medication List    TAKE these medications   buprenorphine 2 MG Subl SL tablet Commonly known as:  SUBUTEX Place under the tongue daily.   clindamycin 300 MG capsule Commonly known as:  CLEOCIN Take 1 capsule (300 mg total) by mouth 3 (three) times daily for 10 days.   clonazePAM 0.5 MG tablet Commonly known as:  KLONOPIN Take by mouth.   OVER THE COUNTER MEDICATION Take 1 capsule by mouth daily. QueenV Daily Probiotic       Discharge Assessment: Vitals:   11/23/18 0832 11/23/18 1116  BP: 113/76 115/86  Pulse: 83 (!) 103  Resp:    Temp: 99 F (37.2 C) 97.9 F (36.6 C)  SpO2: 100% 98%   Skin clean, dry and intact without evidence of skin break down, no evidence of skin tears noted. IV catheter discontinued intact. Site without signs and symptoms of complications - no redness or edema noted at insertion site, patient denies c/o pain - only slight tenderness at site.  Dressing with slight pressure applied.  D/c Instructions-Education: Discharge instructions given to patient/family with verbalized understanding. D/c education completed with patient/family including follow up instructions, medication list, d/c activities limitations if indicated, with other d/c instructions as indicated by MD - patient able to verbalize understanding, all questions fully answered. Patient instructed to return to ED, call 911, or call MD for any changes in condition.  Patient escorted via WC, and D/C home via private auto.  Arman Bogusorothy Takerra Lupinacci, RN 11/23/2018 3:19 PM

## 2018-11-24 LAB — CULTURE, BLOOD (ROUTINE X 2)
Culture: NO GROWTH
Culture: NO GROWTH
SPECIAL REQUESTS: ADEQUATE

## 2018-11-24 NOTE — Discharge Summary (Signed)
Physician Discharge Summary  Nicole [name] WUJ:[id number] DOB: Mar 01, 1991 DOA: 11/19/2018  PCP: Patient, No Pcp Per  Admit date: 11/19/2018 Discharge date: 11/23/2018  Admitted From: Home.  Disposition: Home.   Recommendations for Outpatient Follow-up:  1. Follow up with PCP in 1-2 weeks 2. Please obtain BMP/CBC in one week 3. Please follow up with Dr Jena Gauss as needed.     Discharge Condition:stable.  CODE STATUS: full code.  Diet recommendation: Heart Healthy  Brief/Interim Summary: Nicole Thorenis a 27 y.o.femalewith medical history significant forIVDA (heroin)who presented to the ED today with c/o left arm pain, redness and swelling. She states that on Saturday she was putting her purse on her left arm and noticed that her left arm had become swollen around the elbow. She developed increased swelling and redness on Sunday and Monday as well as pain. She states that sometime during the night last night the wound opened up and drained bloody and purulent discharge. 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  Discharge Diagnoses:  Principal Problem:   Septic arthritis of elbow, left (HCC) Active Problems:   Polysubstance abuse (HCC)   Tachycardia  Cellulitis possibly septic arthritis of the left elbow Patient has extensive edema and erythema of the left arm more near the left elbow, swelling and erythema has improved and she is able to have better range of movement at the elbow.  CT of the elbow does not show any abscess at this time.  Dr. Jena Gauss from orthopedics saw the patient recommended continue with IV antibiotics at this time.  No role for operative intervention at this time. Blood cultures have been negative. Discharged on oral clindamycin to complete the course.    History of IV drug abuse/opioid abuse Patient reports that her last heroin use was about 4 weeks ago.  Reports that she was given a prescription for buprenorphine sublingual and has been  using it since 1 week. We have restarted the buprenorphine sublingual 2 mg daily.   Anemia of chronic disease hemoglobin stable at around 10   History of substance abuse Her last UDS was positive for THC, opiates and amphetamines.  Will need social worker consult for resources.   Discharge Instructions  Discharge Instructions    Diet - low sodium heart healthy   Complete by:  As directed    Discharge instructions   Complete by:  As directed    Follow up with PCP in one week.  Please follow up with Dr Jena Gauss for worsening symptoms or come to ED.     Allergies as of 11/23/2018      Reactions   Hydrocodone Hives, Itching      Medication List    TAKE these medications   buprenorphine 2 MG Subl SL tablet Commonly known as:  SUBUTEX Place under the tongue daily.   clindamycin 300 MG capsule Commonly known as:  CLEOCIN Take 1 capsule (300 mg total) by mouth 3 (three) times daily for 27 days.   clonazePAM 0.5 MG tablet Commonly known as:  KLONOPIN Take by mouth.   OVER THE COUNTER MEDICATION Take 1 capsule by mouth daily. QueenV Daily Probiotic      Follow-up Information    Haddix, Gillie Manners, MD. Schedule an appointment as soon as possible for a visit in 1 week(s).   Specialty:  Orthopedic Surgery Contact information: [address] [place] [address] [phone]        [facility] Follow up.   Why:  You may call to establish primary care here.  Prescription assistance is available after your first visit.  Contact information: [address] [place] [phone]       [facility] Follow [facility].   Specialty:  Internal Medicine Why:  You may call to establish primary care here.  Prescription assistance is available after your first visit.  Contact information: [address] [place] [phone]       [facility].   Specialty:  Family Medicine Why:  You may call to establish primary care here.  Prescription assistance is available after your first visit.  Contact information: [address], [address] [place] [phone]         Allergies  Allergen Reactions  . Hydrocodone Hives and Itching    Consultations:  Orthopedics.    Procedures/Studies: Ct Angio Up Extrem Left W &/or Wo Contast  Result Date: 11/19/2018 CLINICAL DATA:  Left arm swelling, pain, drainage. EXAM: CT ANGIOGRAPHY OF THE LEFT UPPEREXTREMITY TECHNIQUE: Multidetector CT imaging of the left upper extremitywas performed using the standard protocol during bolus administration of intravenous contrast. Multiplanar CT image reconstructions and MIPs were obtained to evaluate the vascular anatomy. CONTRAST:  100mL ISOVUE-370 IOPAMIDOL (ISOVUE-370) INJECTION 76% COMPARISON:  None. FINDINGS: Left upper extremity arterial system is widely patent. Stranding noted in the subcutaneous soft tissues of the left upper extremity, most notable in the antecubital fossa region, likely cellulitis. Edema also appears to involve the biceps muscle and could reflect myositis. I see no drainable fluid collection. No acute bony abnormality. Mild paraseptal emphysema changes noted in the left lung. No confluent opacities or effusions. Heart is normal size. Review of the MIP images confirms the above findings. IMPRESSION: Left upper extremity arterial system widely patent. Stranding in the subcutaneous soft tissues of the left arm by most pronounced in the antecubital fossa region compatible with cellulitis. This extends to and possibly involves the biceps muscle which may reflect myositis. No drainable fluid. Electronically Signed   By: Charlett NoseKevin  Dover M.D.   On: 11/19/2018 20:36     Subjective: No new complaints. She understands that she has the prescriptions for pain meds.   Discharge Exam: Vitals:   11/23/18 0832 11/23/18  1116  BP: 113/76 115/86  Pulse: 83 (!) 103  Resp:    Temp: 99 F (37.2 C) 97.9 F (36.6 C)  SpO2: 100% 98%   Vitals:   11/23/18 0100 11/23/18 0300 11/23/18 0832 11/23/18 1116  BP: 109/73 125/85 113/76 115/86  Pulse: 84 81 83 (!) 103  Resp:      Temp: 98.3 F (36.8 C) 98.4 F (36.9 C) 99 F (37.2 C) 97.9 F (36.6 C)  TempSrc: Oral Oral Oral Oral  SpO2: 98% 99% 100% 98%  Weight:      Height:        General: Pt is alert, awake, not in acute distress Cardiovascular: RRR, S1/S2 +, no rubs, no gallops Respiratory: CTA bilaterally, no wheezing, no rhonchi Abdominal: Soft, NT, ND, bowel sounds + Extremities: left arm swelling and tender ness improved.     The results of significant diagnostics from this hospitalization (including imaging, microbiology, ancillary and laboratory) are listed below for reference.     Microbiology: Recent Results (from the past 240 hour(s))  Blood culture (routine x 2)     Status: None   Collection Time: 11/19/18  3:30 PM  Result  Value Ref Range Status   Specimen Description BLOOD RIGHT ANTECUBITAL  Final   Special Requests   Final    BOTTLES DRAWN AEROBIC AND ANAEROBIC Blood Culture results may not be optimal due to an inadequate volume of blood received in culture bottles   Culture   Final    NO GROWTH 5 DAYS Performed at [facility], [address], [place], [place] [address]    Report Status 11/24/2018 FINAL  Final  Blood culture (routine x 2)     Status: None   Collection Time: 11/19/18  4:15 PM  Result Value Ref Range Status   Specimen Description BLOOD RIGHT ARM  Final   Special Requests   Final    BOTTLES DRAWN AEROBIC AND ANAEROBIC Blood Culture adequate volume   Culture   Final    NO GROWTH 5 DAYS Performed at [facility], [address], [place], [place] [address]    Report Status 11/24/2018 FINAL  Final  MRSA PCR Screening     Status: None   Collection Time: 11/20/18  6:08 AM  Result Value Ref Range Status    MRSA by PCR NEGATIVE NEGATIVE Final    Comment:        The GeneXpert MRSA Assay (FDA approved for NASAL specimens only), is one component of a comprehensive MRSA colonization surveillance program. It is not intended to diagnose MRSA infection nor to guide or monitor treatment for MRSA infections. Performed at [facility], [address], [place], [place] [address]      Labs: BNP (last 3 results) No results for input(s): BNP in the last 8760 hours. Basic Metabolic Panel: Recent Labs  Lab 11/19/18 1532 11/20/18 0246 11/21/18 0513 11/22/18 0353 11/23/18 0702  NA 135 136 138 139 138  K 4.0 4.0 4.2 4.5 4.3  CL 98 104 104 105 105  CO2 28 23 25 25 23   GLUCOSE 117* 107* 106* 113* 90  BUN 7 7 6 9 8   CREATININE 0.75 0.54 0.64 0.72 0.73  CALCIUM 9.2 8.3* 9.0 9.4 9.0   Liver Function Tests: Recent Labs  Lab 11/19/18 1532  AST 46*  ALT 43  ALKPHOS 119  BILITOT 0.9  PROT 8.1  ALBUMIN 3.6   No results for input(s): LIPASE, AMYLASE in the last 168 hours. No results for input(s): AMMONIA in the last 168 hours. CBC: Recent Labs  Lab 11/19/18 1532 11/20/18 0246 11/21/18 0513 11/22/18 0353 11/23/18 0702  WBC 9.0 7.6 4.0 5.8 5.6  NEUTROABS 6.1  --   --   --   --   HGB 11.3* 9.2* 10.5* 11.6* 11.3*  HCT 35.8* 30.3* 33.6* 36.9 34.3*  MCV 85.4 86.8 85.3 83.7 82.1  PLT 343 281 345 403* 350   Cardiac Enzymes: No results for input(s): CKTOTAL, CKMB, CKMBINDEX, TROPONINI in the last 168 hours. BNP: Invalid input(s): POCBNP CBG: No results for input(s): GLUCAP in the last 168 hours. D-Dimer No results for input(s): DDIMER in the last 72 hours. Hgb A1c No results for input(s): HGBA1C in the last 72 hours. Lipid Profile No results for input(s): CHOL, HDL, LDLCALC, TRIG, CHOLHDL, LDLDIRECT in the last 72 hours. Thyroid function studies No results for input(s): TSH, T4TOTAL, T3FREE, THYROIDAB in the last 72 hours.  Invalid input(s): FREET3 Anemia work up No  results for input(s): VITAMINB12, FOLATE, FERRITIN, TIBC, IRON, RETICCTPCT in the last 72 hours. Urinalysis    Component Value Date/Time   COLORURINE YELLOW 05/03/2018 0225  APPEARANCEUR CLEAR 05/03/2018 0225   LABSPEC 1.030 05/03/2018 0225   PHURINE 5.0 05/03/2018 0225   GLUCOSEU NEGATIVE 05/03/2018 0225   HGBUR SMALL (A) 05/03/2018 0225   BILIRUBINUR NEGATIVE 05/03/2018 0225   KETONESUR 5 (A) 05/03/2018 0225   PROTEINUR NEGATIVE 05/03/2018 0225   UROBILINOGEN 0.2 07/21/2015 2359   NITRITE POSITIVE (A) 05/03/2018 0225   LEUKOCYTESUR NEGATIVE 05/03/2018 0225   Sepsis Labs Invalid input(s): PROCALCITONIN,  WBC,  LACTICIDVEN Microbiology Recent Results (from the past 240 hour(s))  Blood culture (routine x 2)     Status: None   Collection Time: 11/19/18  3:30 PM  Result Value Ref Range Status   Specimen Description BLOOD RIGHT ANTECUBITAL  Final   Special Requests   Final    BOTTLES DRAWN AEROBIC AND ANAEROBIC Blood Culture results may not be optimal due to an inadequate volume of blood received in culture bottles   Culture   Final    NO GROWTH 5 DAYS Performed at [facility], [address] [place], [place] [address]    Report Status 11/24/2018 FINAL  Final  Blood culture (routine x 2)     Status: None   Collection Time: 11/19/18  4:15 PM  Result Value Ref Range Status   Specimen Description BLOOD RIGHT ARM  Final   Special Requests   Final    BOTTLES DRAWN AEROBIC AND ANAEROBIC Blood Culture adequate volume   Culture   Final    NO GROWTH 5 DAYS Performed at [facility], [address], [place], [place] [address]    Report Status 11/24/2018 FINAL  Final  MRSA PCR Screening     Status: None   Collection Time: 11/20/18  6:08 AM  Result Value Ref Range Status   MRSA by PCR NEGATIVE NEGATIVE Final    Comment:        The GeneXpert MRSA Assay (FDA approved for NASAL specimens only), is one component of a comprehensive MRSA colonization surveillance program.  It is not intended to diagnose MRSA infection nor to guide or monitor treatment for MRSA infections. Performed at [facility], [address] [place], [place] [address]      Time coordinating discharge: 32  minutes  SIGNED:   Kathlen Mody, MD  [facility] 11/24/2018, 5:33 PM Pager   If 7PM-7AM, please contact night-coverage [email or web address] Password [email or web address]

## 2018-12-09 ENCOUNTER — Encounter: Payer: Self-pay | Admitting: Student

## 2019-08-19 ENCOUNTER — Other Ambulatory Visit: Payer: Self-pay

## 2019-08-19 ENCOUNTER — Emergency Department (HOSPITAL_COMMUNITY)
Admission: EM | Admit: 2019-08-19 | Discharge: 2019-08-20 | Disposition: A | Discharge status: Home / Self Care | Payer: Self-pay | Attending: Emergency Medicine | Admitting: Emergency Medicine

## 2019-08-19 DIAGNOSIS — J45909 Unspecified asthma, uncomplicated: Secondary | ICD-10-CM | POA: Insufficient documentation

## 2019-08-19 DIAGNOSIS — R110 Nausea: Secondary | ICD-10-CM | POA: Insufficient documentation

## 2019-08-19 DIAGNOSIS — M7918 Myalgia, other site: Secondary | ICD-10-CM | POA: Insufficient documentation

## 2019-08-19 DIAGNOSIS — F1120 Opioid dependence, uncomplicated: Secondary | ICD-10-CM | POA: Insufficient documentation

## 2019-08-19 DIAGNOSIS — Z79899 Other long term (current) drug therapy: Secondary | ICD-10-CM | POA: Insufficient documentation

## 2019-08-19 DIAGNOSIS — F17210 Nicotine dependence, cigarettes, uncomplicated: Secondary | ICD-10-CM | POA: Insufficient documentation

## 2019-08-19 LAB — ETHANOL: Alcohol, Ethyl (B): <10 mg/dL (ref <10)

## 2019-08-19 LAB — CBC
HCT: 36.2 % (ref 36.0–46.0)
Hemoglobin: 11.3 g/dL — ABNORMAL LOW (ref 12.0–15.0)
MCH: 27.7 pg (ref 26.0–34.0)
MCHC: 31.2 g/dL (ref 30.0–36.0)
MCV: 88.7 fL (ref 80.0–100.0)
Platelets: 320 10*3/uL (ref 150–400)
RBC: 4.08 MIL/uL (ref 3.87–5.11)
RDW: 14.1 % (ref 11.5–15.5)
WBC: 8.7 10*3/uL (ref 4.0–10.5)
nRBC: 0 % (ref 0.0–0.2)

## 2019-08-19 LAB — COMPREHENSIVE METABOLIC PANEL
ALT: 21 U/L (ref 0–44)
AST: 22 U/L (ref 15–41)
Albumin: 4.3 g/dL (ref 3.5–5.0)
Alkaline Phosphatase: 69 U/L (ref 38–126)
Anion gap: 12 (ref 5–15)
BUN: 13 mg/dL (ref 6–20)
CO2: 21 mmol/L — ABNORMAL LOW (ref 22–32)
Calcium: 9.1 mg/dL (ref 8.9–10.3)
Chloride: 103 mmol/L (ref 98–111)
Creatinine, Ser: 0.71 mg/dL (ref 0.44–1.00)
GFR calc Af Amer: >60 mL/min (ref >60)
GFR calc non Af Amer: >60 mL/min (ref >60)
Glucose, Bld: 105 mg/dL — ABNORMAL HIGH (ref 70–99)
Potassium: 4.1 mmol/L (ref 3.5–5.1)
Sodium: 136 mmol/L (ref 135–145)
Total Bilirubin: 0.2 mg/dL — ABNORMAL LOW (ref 0.3–1.2)
Total Protein: 7.7 g/dL (ref 6.5–8.1)

## 2019-08-19 LAB — RAPID URINE DRUG SCREEN, HOSP PERFORMED
Amphetamines: POSITIVE — AB
Barbiturates: NOT DETECTED
Benzodiazepines: NOT DETECTED
Cocaine: NOT DETECTED
Opiates: POSITIVE — AB
Tetrahydrocannabinol: NOT DETECTED

## 2019-08-19 LAB — I-STAT BETA HCG BLOOD, ED (MC, WL, AP ONLY): I-stat hCG, quantitative: <5 m[IU]/mL (ref <5)

## 2019-08-19 NOTE — ED Triage Notes (Signed)
Pt reports she is here for heroin detox. She reports last use was earlier, she denies other drug use or alcohol abuse. Pt reports headache.

## 2019-08-20 MED ORDER — NAPROXEN 250 MG PO TABS: 375.0000 mg | ORAL_TABLET | Freq: Once | ORAL | Status: DC

## 2019-08-20 NOTE — ED Notes (Signed)
Pt. Has been called 4x for vitals and has not been seen in the waiting room, pt will be put OTF.

## 2019-08-20 NOTE — ED Provider Notes (Signed)
[facility] Provider Note   CSN: [id number] Arrival date & time: 08/19/19  2140     History   Chief Complaint Chief Complaint  Patient presents with  . Drug Problem    HPI Nicole [name] is a 28 y.o. female.     Patient to ED requesting help with heroin addiction. She has been using heroin IV, last use yesterday. No SI/HI/AVH. She reports generalized body pain. She had nausea without vomiting earlier tonight but no nausea now. She is requesting resources for detox.   The history is provided by the patient. No language interpreter was used.  Drug Problem    Past Medical History:  Diagnosis Date  . Depression    hx age 28, fine now  . Dysrhythmia    "top of heart beats 2 times faster than bottom of heart" (11/20/2018)  . Exercise-induced asthma   . GERD (gastroesophageal reflux disease)   . Headache    "at least 1/month" (11/20/2018)  . Hypoglycemia   . Irregular heart rate   . Recurrent UTI (urinary tract infection)    UTI  . Scoliosis   . Seizure (HCC) 2019 X 1   "never went to hospital; don't know why I had this" (11/20/2018)  . Syncope     Patient Active Problem List   Diagnosis Date Noted  . Cellulitis of left elbow 11/19/2018  . Anxiety 11/19/2018  . Tachycardia 11/19/2018  . Arthritis 01/26/2018  . Polysubstance abuse (HCC) 01/26/2018  . Active labor 08/05/2015  . Diffuse abdominal pain 05/02/2015  . Abdominal pain during pregnancy, antepartum   . [redacted] weeks gestation of pregnancy   . [redacted] weeks gestation of pregnancy   . Encounter for fetal anatomic survey   . Hereditary disease in family possibly affecting fetus, affecting management of mother, antepartum condition or complication   . 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 MOUTH SURGERY     "cut skin out of upper lip area"     OB History    Gravida  2   Para  2   Term  2   Preterm      AB       Living  2     SAB      TAB      Ectopic      Multiple  0   Live Births  2            Home Medications    Prior to Admission medications   Medication Sig Start Date End Date Taking? Authorizing Provider  buprenorphine (SUBUTEX) 2 MG SUBL SL tablet Place under the tongue daily.    [provider]  clonazePAM (KLONOPIN) 0.5 MG tablet Take by mouth.    [provider]  OVER THE COUNTER MEDICATION Take 1 capsule by mouth daily. QueenV Daily Probiotic    [provider]    Family History Family History  Problem Relation Age of Onset  . Cancer Mother [id number]       breast  . Cancer Maternal Grandmother        lung  . Diabetes Paternal Grandfather   . Otilio Jeffersonierre Robin sequence Son        diagnosed prenatally, delivered and [facility]    Social History Social History   Tobacco Use  . Smoking status: Current Every Day Smoker    Packs/day: 0.50    Years:  12.00    Pack years: 6.00    Types: Cigarettes  . Smokeless tobacco: Never Used  Substance Use Topics  . Alcohol use: Not Currently    Comment: 11/20/2018 "drink maybe once/year"  . Drug use: Not Currently    Types: Marijuana    Comment: 11/20/2018 "couple times/year"     Allergies   Hydrocodone   Review of Systems Review of Systems  Constitutional: Negative for chills, diaphoresis and fever.  HENT: Negative.   Respiratory: Negative.   Cardiovascular: Negative.   Gastrointestinal: Positive for nausea. Negative for vomiting.  Musculoskeletal: Positive for myalgias.  Skin: Negative.   Neurological: Negative.      Physical Exam Updated Vital Signs BP 101/62   Pulse 78   Temp 98.2 F (36.8 C) (Oral)   Resp 20   LMP 07/19/2019 (Approximate)   SpO2 99%   Physical Exam Constitutional:      Appearance: She is well-developed.  HENT:     Head: Normocephalic.  Neck:     Musculoskeletal: Normal range of motion and neck supple.  Cardiovascular:     Rate and  Rhythm: Normal rate and regular rhythm.  Pulmonary:     Effort: Pulmonary effort is normal.     Breath sounds: Normal breath sounds. No wheezing, rhonchi or rales.  Abdominal:     General: Bowel sounds are normal.     Palpations: Abdomen is soft.     Tenderness: There is no abdominal tenderness. There is no guarding or rebound.  Musculoskeletal: Normal range of motion.  Skin:    General: Skin is warm and dry.     Findings: No erythema.     Comments: Multiple track marks to bilateral arms without evidence of infection.   Neurological:     Mental Status: She is alert and oriented to person, place, and time.      ED Treatments / Results  Labs (all labs ordered are listed, but only abnormal results are displayed) Labs Reviewed  COMPREHENSIVE METABOLIC PANEL - Abnormal; Notable for the following components:      Result Value   CO2 21 (*)    Glucose, Bld 105 (*)    Total Bilirubin 0.2 (*)    All other components within normal limits  CBC - Abnormal; Notable for the following components:   Hemoglobin 11.3 (*)    All other components within normal limits  RAPID URINE DRUG SCREEN, HOSP PERFORMED - Abnormal; Notable for the following components:   Opiates POSITIVE (*)    Amphetamines POSITIVE (*)    All other components within normal limits  ETHANOL  I-STAT BETA HCG BLOOD, ED ([facility], [facility], [facility] ONLY)    EKG None  Radiology No results found.  Procedures Procedures (including critical care time)  Medications Ordered in ED Medications - No data to display   Initial Impression / Assessment and Plan / ED Course  I have reviewed the triage vital signs and the nursing notes.  Pertinent labs & imaging results that were available during my care of the patient were reviewed by me and considered in my medical decision making (see chart for details).        Patient with heroin addiction presents requesting detox. No SI/HI/AVH. C/O whole body pain, brief nausea earlier without  vomiting.   She has normal VS, labs reassuring. She is felt appropriate for discharge home with resources for drug treatment programs. Will give symptomatic treatment with Rx Zofran and naproxen.  Final Clinical Impressions(s) / ED Diagnoses  Final diagnoses:  None   1. Heroin addiction  ED Discharge Orders    None       Charlann Lange, Hershal Coria 08/20/19 7989    Mesner, Corene Cornea, MD 08/20/19 2119

## 2019-08-20 NOTE — ED Notes (Signed)
Pt denies SI/HI/AVH states that she just wants help with detox from heroin and xanax

## 2019-08-20 NOTE — ED Notes (Signed)
Pt in triage waiting area

## 2020-04-05 ENCOUNTER — Emergency Department (HOSPITAL_BASED_OUTPATIENT_CLINIC_OR_DEPARTMENT_OTHER)
Admission: EM | Admit: 2020-04-05 | Discharge: 2020-04-05 | Disposition: A | Discharge status: Home / Self Care | Payer: Self-pay | Attending: Emergency Medicine | Admitting: Emergency Medicine

## 2020-04-05 ENCOUNTER — Other Ambulatory Visit: Payer: Self-pay

## 2020-04-05 ENCOUNTER — Emergency Department (HOSPITAL_BASED_OUTPATIENT_CLINIC_OR_DEPARTMENT_OTHER): Payer: Self-pay

## 2020-04-05 ENCOUNTER — Encounter (HOSPITAL_BASED_OUTPATIENT_CLINIC_OR_DEPARTMENT_OTHER): Payer: Self-pay | Admitting: Emergency Medicine

## 2020-04-05 DIAGNOSIS — N390 Urinary tract infection, site not specified: Secondary | ICD-10-CM

## 2020-04-05 DIAGNOSIS — Z79899 Other long term (current) drug therapy: Secondary | ICD-10-CM | POA: Insufficient documentation

## 2020-04-05 DIAGNOSIS — R55 Syncope and collapse: Secondary | ICD-10-CM | POA: Insufficient documentation

## 2020-04-05 DIAGNOSIS — R197 Diarrhea, unspecified: Secondary | ICD-10-CM | POA: Insufficient documentation

## 2020-04-05 DIAGNOSIS — E860 Dehydration: Secondary | ICD-10-CM

## 2020-04-05 DIAGNOSIS — R1110 Vomiting, unspecified: Secondary | ICD-10-CM | POA: Insufficient documentation

## 2020-04-05 DIAGNOSIS — F17210 Nicotine dependence, cigarettes, uncomplicated: Secondary | ICD-10-CM | POA: Insufficient documentation

## 2020-04-05 LAB — URINALYSIS, MICROSCOPIC (REFLEX)

## 2020-04-05 LAB — URINALYSIS, ROUTINE W REFLEX MICROSCOPIC
Bilirubin Urine: NEGATIVE
Glucose, UA: NEGATIVE mg/dL
Hgb urine dipstick: NEGATIVE
Ketones, ur: NEGATIVE mg/dL
Leukocytes,Ua: NEGATIVE
Nitrite: POSITIVE — AB
Protein, ur: NEGATIVE mg/dL
Specific Gravity, Urine: 1.015 (ref 1.005–1.030)
pH: 6 (ref 5.0–8.0)

## 2020-04-05 LAB — CBC
HCT: 40.7 % (ref 36.0–46.0)
Hemoglobin: 12.8 g/dL (ref 12.0–15.0)
MCH: 26.9 pg (ref 26.0–34.0)
MCHC: 31.4 g/dL (ref 30.0–36.0)
MCV: 85.7 fL (ref 80.0–100.0)
Platelets: 516 10*3/uL — ABNORMAL HIGH (ref 150–400)
RBC: 4.75 MIL/uL (ref 3.87–5.11)
RDW: 14.8 % (ref 11.5–15.5)
WBC: 9.2 10*3/uL (ref 4.0–10.5)
nRBC: 0 % (ref 0.0–0.2)

## 2020-04-05 LAB — BASIC METABOLIC PANEL
Anion gap: 10 (ref 5–15)
BUN: 17 mg/dL (ref 6–20)
CO2: 28 mmol/L (ref 22–32)
Calcium: 10.5 mg/dL — ABNORMAL HIGH (ref 8.9–10.3)
Chloride: 100 mmol/L (ref 98–111)
Creatinine, Ser: 0.91 mg/dL (ref 0.44–1.00)
GFR calc Af Amer: >60 mL/min (ref >60)
GFR calc non Af Amer: >60 mL/min (ref >60)
Glucose, Bld: 93 mg/dL (ref 70–99)
Potassium: 4.7 mmol/L (ref 3.5–5.1)
Sodium: 138 mmol/L (ref 135–145)

## 2020-04-05 LAB — PREGNANCY, URINE: Preg Test, Ur: NEGATIVE

## 2020-04-05 LAB — CBG MONITORING, ED: Glucose-Capillary: 77 mg/dL (ref 70–99)

## 2020-04-05 MED ORDER — ACETAMINOPHEN 325 MG PO TABS
650.0000 mg | ORAL_TABLET | Freq: Once | ORAL | Status: AC
  Administered 2020-04-05: 13:00:00 650 mg via ORAL
  Filled 2020-04-05: qty 2

## 2020-04-05 MED ORDER — SODIUM CHLORIDE 0.9 % IV SOLN
1.0000 g | Freq: Once | INTRAVENOUS | Status: AC
  Administered 2020-04-05: 1 g via INTRAVENOUS
  Filled 2020-04-05: qty 10

## 2020-04-05 MED ORDER — SODIUM CHLORIDE 0.9 % IV BOLUS
1000.0000 mL | Freq: Once | INTRAVENOUS | Status: AC
  Administered 2020-04-05: 1000 mL via INTRAVENOUS

## 2020-04-05 MED ORDER — SODIUM CHLORIDE 0.9 % IV BOLUS
1000.0000 mL | Freq: Once | INTRAVENOUS | Status: AC
  Administered 2020-04-05: 13:00:00 1000 mL via INTRAVENOUS

## 2020-04-05 MED ORDER — ONDANSETRON 4 MG PO TBDP: ORAL_TABLET | ORAL | 0 refills | Status: DC

## 2020-04-05 MED ORDER — CEPHALEXIN 500 MG PO CAPS: 500.0000 mg | ORAL_CAPSULE | Freq: Four times a day (QID) | ORAL | 0 refills | Status: DC

## 2020-04-05 NOTE — ED Triage Notes (Signed)
Pt is from [facility]. States she has had diarrhea and vomiting since last night. States she was in the bathroom vomiting and woke up on the floor with head pain and presumed she passed out.

## 2020-04-05 NOTE — ED Provider Notes (Signed)
29 year old female who I received in signout from Dr. Charm Barges.  Briefly the patient has been having nausea vomiting and diarrhea and was weak today and passed out.  Thought to be due to hypovolemia.  Was orthostatic and had improvement with IV fluids.  Plan is for reassessment post fluids for ease of ambulation.  Patient is able to ambulate independently without issue.  Tachycardia has resolved with fluids.  There is questionable urinary tract infection based on lab work and so will start on Keflex.  Prescription home for Zofran as well.  PCP follow-up.   Melene Plan, DO 04/05/20 1547

## 2020-04-05 NOTE — ED Provider Notes (Signed)
[facility] EMERGENCY DEPARTMENT Provider Note   CSN: [id number] Arrival date & time: 04/05/20  1039     History Chief Complaint  Patient presents with  . Loss of Consciousness    Nicole [name] is a 29 y.o. female.  She is currently a resident at [facility] for the last for 5 days.  She said she had some diarrhea and vomiting starting last night.  She was going to vomit again today and so went to the bathroom and before she got there she passed out striking her head.  Complaining of pain at the top of her head and around her right eye.  No sick contacts.  No fevers or chills.  No abdominal pain.  The history is provided by the patient.  Loss of Consciousness Episode history:  Single Most recent episode:  Today Timing:  Unable to specify Progression:  Resolved Chronicity:  Recurrent Relieved by:  Lying down Worsened by:  Nothing Ineffective treatments:  None tried Associated symptoms: headaches, nausea and vomiting   Associated symptoms: no chest pain, no difficulty breathing, no fever, no focal sensory loss, no focal weakness, no recent surgery, no rectal bleeding and no shortness of breath        Past Medical History:  Diagnosis Date  . Depression    hx age 53, fine now  . Dysrhythmia    "top of heart beats 2 times faster than bottom of heart" (11/20/2018)  . Exercise-induced asthma   . GERD (gastroesophageal reflux disease)   . Headache    "at least 1/month" (11/20/2018)  . Hypoglycemia   . Irregular heart rate   . Recurrent UTI (urinary tract infection)    UTI  . Scoliosis   . Seizure (HCC) 2019 X 1   "never went to hospital; don't know why I had this" (11/20/2018)  . Syncope     Patient Active Problem List   Diagnosis Date Noted  . Cellulitis of left elbow 11/19/2018  . Anxiety 11/19/2018  . Tachycardia 11/19/2018  . Arthritis 01/26/2018  . Polysubstance abuse (HCC) 01/26/2018  . Active labor 08/05/2015  . Diffuse abdominal pain 05/02/2015  .  Abdominal pain during pregnancy, antepartum   . [redacted] weeks gestation of pregnancy   . [redacted] weeks gestation of pregnancy   . Encounter for fetal anatomic survey   . Hereditary disease in family possibly affecting fetus, affecting management of mother, antepartum condition or complication   . 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 MOUTH SURGERY     "cut skin out of upper lip area"     OB History    Gravida  2   Para  2   Term  2   Preterm      AB      Living  2     SAB      TAB      Ectopic      Multiple  0   Live Births  2           Family History  Problem Relation Age of Onset  . Cancer Mother 58       breast  . Cancer Maternal Grandmother        lung  . Diabetes Paternal Grandfather   . Otilio [employer] sequence Son        diagnosed prenatally, delivered and followed postnat[facility] with [facility]    Social History   Tobacco Use  .  Smoking status: Current Every Day Smoker    Packs/day: 0.50    Years: 12.00    Pack years: 6.00    Types: Cigarettes  . Smokeless tobacco: Never Used  Substance Use Topics  . Alcohol use: Not Currently    Comment: 11/20/2018 "drink maybe once/year"  . Drug use: Not Currently    Types: Marijuana    Comment: 11/20/2018 "couple times/year"    Home Medications Prior to Admission medications   Medication Sig Start Date End Date Taking? Authorizing Provider  buprenorphine (SUBUTEX) 2 MG SUBL SL tablet Place under the tongue daily.    [provider]  clonazePAM (KLONOPIN) 0.5 MG tablet Take by mouth.    [provider]  OVER THE COUNTER MEDICATION Take 1 capsule by mouth daily. QueenV Daily Probiotic    [provider]    Allergies    Hydrocodone  Review of Systems   Review of Systems  Constitutional: Negative for fever.  HENT: Negative for sore throat.   Eyes: Negative for visual disturbance.  Respiratory: Negative for shortness of breath.     Cardiovascular: Positive for syncope. Negative for chest pain.  Gastrointestinal: Positive for nausea and vomiting. Negative for abdominal pain.  Genitourinary: Negative for dysuria.  Musculoskeletal: Negative for neck pain.  Skin: Negative for rash.  Neurological: Positive for headaches. Negative for focal weakness.    Physical Exam Updated Vital Signs BP 100/76 (BP Location: Right Arm)   Pulse (!) 117   Temp 99.5 F (37.5 C) (Oral)   Resp 20   Ht 5\' 9"  (1.753 m)   Wt 58.5 kg   LMP 03/02/2020   SpO2 100%   BMI 19.05 kg/m   Physical Exam Vitals and nursing note reviewed.  Constitutional:      General: She is not in acute distress.    Appearance: She is well-developed.  HENT:     Head: Normocephalic.     Comments: She has some tenderness at the top of her head, small amount of swelling.  Also some tenderness around the right orbit although no swelling or crepitus. Eyes:     Extraocular Movements: Extraocular movements intact.     Conjunctiva/sclera: Conjunctivae normal.     Pupils: Pupils are equal, round, and reactive to light.  Cardiovascular:     Rate and Rhythm: Normal rate and regular rhythm.     Heart sounds: No murmur.  Pulmonary:     Effort: Pulmonary effort is normal. No respiratory distress.     Breath sounds: Normal breath sounds.  Abdominal:     Palpations: Abdomen is soft.     Tenderness: There is no abdominal tenderness.  Musculoskeletal:        General: No deformity or signs of injury. Normal range of motion.     Cervical back: Neck supple.  Skin:    General: Skin is warm and dry.     Capillary Refill: Capillary refill takes less than 2 seconds.     Comments: Multiple track marks on arms.  Neurological:     General: No focal deficit present.     Mental Status: She is alert and oriented to person, place, and time.     Sensory: No sensory deficit.     Motor: No weakness.     Gait: Gait normal.     ED Results / Procedures / Treatments    Labs (all labs ordered are listed, but only abnormal results are displayed) Labs Reviewed  BASIC METABOLIC PANEL - Abnormal; Notable for  the following components:      Result Value   Calcium 10.5 (*)    All other components within normal limits  CBC - Abnormal; Notable for the following components:   Platelets 516 (*)    All other components within normal limits  URINALYSIS, ROUTINE W REFLEX MICROSCOPIC - Abnormal; Notable for the following components:   APPearance CLOUDY (*)    Nitrite POSITIVE (*)    All other components within normal limits  URINALYSIS, MICROSCOPIC (REFLEX) - Abnormal; Notable for the following components:   Bacteria, UA MANY (*)    All other components within normal limits  PREGNANCY, URINE  CBG MONITORING, ED    EKG EKG Interpretation  Date/Time:  Monday April 05 2020 11:12:02 EDT Ventricular Rate:  104 PR Interval:    QRS Duration: 83 QT Interval:  323 QTC Calculation: 425 R Axis:   84 Text Interpretation: Sinus tachycardia Baseline wander in lead(s) II III aVF rate faster than prior 3/13 Confirmed by Meridee Score [phone]) on 04/05/2020 11:15:16 AM   Radiology CT Head Wo Contrast  Result Date: 04/05/2020 CLINICAL DATA:  Diarrhea and vomiting. Presumed syncopal episode. History of seizures. EXAM: CT HEAD WITHOUT CONTRAST TECHNIQUE: Contiguous axial images were obtained from the base of the skull through the vertex without intravenous contrast. COMPARISON:  CT head 10/26/2013. FINDINGS: Brain: There is no evidence of acute intracranial hemorrhage, mass lesion, brain edema or extra-axial fluid collection. The ventricles and subarachnoid spaces are appropriately sized for age. There is no CT evidence of acute cortical infarction. Vascular:  No hyperdense vessel identified. Skull: Negative for fracture or focal lesion. Sinuses/Orbits: The visualized paranasal sinuses and mastoid air cells are clear. No orbital abnormalities are seen. Other: None. IMPRESSION:  Normal noncontrast head CT. Electronically Signed   By: Carey Bullocks M.D.   On: 04/05/2020 11:33    Procedures Procedures (including critical care time)  Medications Ordered in ED Medications  sodium chloride 0.9 % bolus 1,000 mL (0 mLs Intravenous Stopped 04/05/20 1257)  sodium chloride 0.9 % bolus 1,000 mL (0 mLs Intravenous Stopped 04/05/20 1429)  acetaminophen (TYLENOL) tablet 650 mg (650 mg Oral Given 04/05/20 1316)  cefTRIAXone (ROCEPHIN) 1 g in sodium chloride 0.9 % 100 mL IVPB ( Intravenous Stopped 04/05/20 1509)    ED Course  I have reviewed the triage vital signs and the nursing notes.  Pertinent labs & imaging results that were available during my care of the patient were reviewed by me and considered in my medical decision making (see chart for details).  Clinical Course as of Apr 05 1724  Mon Apr 05, 2020  1422 Labs significant for equivocal urine of 11-20 whites many bacteria nitrite positive.  We will give IV dose of antibiotics.  Patient also remains orthostatics are getting more fluids.   [MB]    Clinical Course User Index [MB] Terrilee Files, MD   MDM Rules/Calculators/A&P                     This patient complains of syncope; this involves an extensive number of treatment Options and is a complaint that carries with it a high risk of complications and Morbidity. The differential includes arrhythmia, metabolic derangement, dehydration, infection, anemia, pregnancy  I ordered, reviewed and interpreted labs, which included normal CBC other than mildly elevated platelets of 516 likely reactive.  Normal chemistry other than slightly elevated calcium at 10.5.  Pregnancy negative.  UA equivocal with nitrite positive but only 11-20 whites.  I ordered medication IV fluids and IV ceftriaxone I ordered imaging studies which included head CT and I independently    visualized and interpreted imaging which showed no acute bleed Previous records obtained and reviewed in  epic  After the interventions stated above, I reevaluated the patient and found improved symptoms and tolerating fluids.  She understands to finish antibiotics and continue to hydrate.  Reviewed return instructions.  Tachycardia resolved  Final Clinical Impression(s) / ED Diagnoses Final diagnoses:  Syncope and collapse  Dehydration  Lower urinary tract infectious disease    Rx / DC Orders ED Discharge Orders         Ordered    cephALEXin (KEFLEX) 500 MG capsule  4 times daily     04/05/20 1435    ondansetron (ZOFRAN ODT) 4 MG disintegrating tablet     04/05/20 1544           Hayden Rasmussen, MD 04/05/20 1728

## 2020-04-05 NOTE — Discharge Instructions (Addendum)
You are seen in the emergency department for evaluation of a fainting spell in the setting of nausea and vomiting.  Your work-up showed that you were dehydrated.  You also possibly have a urinary tract infection and so we are starting you on some antibiotics.  Will be very important for you to keep well-hydrated.  Please finish your antibiotics.  Follow-up with your doctor and return to the emergency department if any worsening or concerning symptoms

## 2020-04-05 NOTE — ED Notes (Signed)
Pt unable to provide urine at this time

## 2020-04-05 NOTE — ED Notes (Signed)
ED Provider at bedside. 

## 2020-12-05 ENCOUNTER — Ambulatory Visit (HOSPITAL_COMMUNITY)
Admission: EM | Admit: 2020-12-05 | Discharge: 2020-12-05 | Disposition: A | Discharge status: Home / Self Care | Payer: No Payment, Other | Attending: Family | Admitting: Family

## 2020-12-05 ENCOUNTER — Other Ambulatory Visit: Payer: Self-pay

## 2020-12-05 ENCOUNTER — Encounter (HOSPITAL_COMMUNITY): Payer: Self-pay | Admitting: Emergency Medicine

## 2020-12-05 DIAGNOSIS — F1510 Other stimulant abuse, uncomplicated: Secondary | ICD-10-CM | POA: Diagnosis not present

## 2020-12-05 DIAGNOSIS — Z87898 Personal history of other specified conditions: Secondary | ICD-10-CM | POA: Insufficient documentation

## 2020-12-05 NOTE — ED Triage Notes (Signed)
Patient states she has a substance abuse history and she is on subutex at this time and use twice a week heroin and meth. Patient is having auditory hallucinations and is been paranoid someone is out to get her and take her kids away. Patient is having visual hallucinations where she thought she saw the police at her hotel today. Patient denies SI/HI. Patient is very restless and unable to sit still doing triage and for vitals.

## 2020-12-05 NOTE — Discharge Instructions (Addendum)
Take all medications as prescribed. Keep all follow-up appointments as scheduled.  Do not consume alcohol or use illegal drugs while on prescription medications. Report any adverse effects from your medications to your primary care provider promptly.  In the event of recurrent symptoms or worsening symptoms, call 911, a crisis hotline, or go to the nearest emergency department for evaluation.   

## 2020-12-05 NOTE — ED Provider Notes (Signed)
Behavioral Health Urgent Care Medical Screening Exam  Patient Name: Nicole [name] MRN: [id number] Date of Evaluation: 12/05/20 Chief Complaint:   Diagnosis:  Final diagnoses:  Methamphetamine abuse (HCC)    History of Present illness: [name] is a 29 y.o. female.  Presents as a walk-in to [facility] behavioral [facility] urgent care center accompanied by her mother requesting resources for detox.  Chart review patient has a history of polysubstance abuse.  Sonnet denies suicidal or homicidal ideations.  Reports auditory hallucination is worse with substance use/abuse.  States she last used " ICE and Subutex" this morning.  Denies hallucinations or command in nature.  Denied history of suicidal attempts.  Mother is requesting a direct admission in to a substances abuse program.  TTS counselor seeking additional outpatient resources for substance abuse.  Denies any other safety concerns at this time.  patient to follow-up for medical clearance.  Mother reports she is going to take patient to [facility].  Support,encouragement and reassurance was provided  Psychiatric Specialty Exam  Presentation  General Appearance:Disheveled  Eye Contact:Good  Speech:Clear and Coherent  Speech Volume:Normal  Handedness:Right   Mood and Affect  Mood:Anxious  Affect:Congruent   Thought Process  Thought Processes:Coherent  Descriptions of Associations:Intact  Orientation:Full (Time, Place and Person)  Thought Content:Paranoid Ideation  Hallucinations:Auditory  Ideas of Reference:Paranoia  Suicidal Thoughts:No  Homicidal Thoughts:No   Sensorium  Memory:Immediate Good; Recent Good; Remote Good  Judgment:Intact  Insight:Fair   Executive Functions  Concentration:Fair  Attention Span:Fair  Recall:Fair  Fund of Knowledge:Fair  Language:Fair; Good   Psychomotor Activity  Psychomotor Activity:Extrapyramidal Side Effects (EPS) Akathisia;  Dystonia No   Assets  Assets:Communication Skills; Physical Health   Sleep  Sleep:Poor  Number of hours: No data recorded  Physical Exam: Physical Exam ROS unknown if currently breastfeeding. There is no height or weight on file to calculate BMI.  Musculoskeletal: Strength & Muscle Tone: within normal limits Gait & Station: unable to sit still  Patient leans: N/A   [facility] MSE Discharge Disposition for Follow up and Recommendations: Based on my evaluation the patient does not appear to have an emergency medical condition and can be discharged with resources and follow up care in outpatient services for Substance Abuse Intensive Outpatient Program   Oneta Rack, NP 12/05/2020, 5:06 PM

## 2020-12-05 NOTE — BH Assessment (Signed)
Comprehensive Clinical Assessment (CCA) Note  12/05/2020 Nicole [name] [id number]  Nicole [name] is a 29 year old female presenting to [facility] voluntarily with her mom looking for detox programs due to patient significant polysubstance abuse. Patient presents with sever psychomotor agitation, she is tearful, and her thoughts are disorganized. Patient has difficulty concentrating and answering clinician questions appropriately, so she looks to her mother for support with answering the questions. [name] reports that patient has been having symptoms of auditory hallucinations, paranoia and disorganized thoughts. Mom reports patient symptoms have been getting worse. Mom reports that patient told her she was hearing her grandmother and cousin voice who are deceased. Mom states that today patient is fearful that something bad is going to happen to her two sons (5 and 9). Mom reports that she has custody of one of the sons and the other son is with his father. Mom reports that children are safe and taken care of. Mom reports that patient feels like the police were going to arrest her today.   Patient acknowledges use of ICE and Subutex today and reports using heroin two days ago. Patient reports substance administration intravenously, nasally, orally and most recently rectal due to pain in her arm. Patient reports history of residential treatment at [facility] which was short lived. Patient reports Subutex is prescribed by provider in [facility] but can not recall name of provider or name of practice. Patient has pending charges of possession with intent to sale but does not have a court date yet. Patient reports living in a hotel with her boyfriend who also uses drugs. Patient reports feeling depressed and acknowledges symptoms of crying, irritable and irregular sleep patterns. Patient reports having withdrawal symptoms but cannot articulate the symptoms.   Patient denies SI/HI/SIB however reports history  of SIB in grade school. Patient reports auditory hallucinations but denies visual hallucinations. Patient is tearful during assessment and becomes increasingly paranoid and suspicious that clinician is concerned about her kids.  Disposition: Per Hillery Jacks, NP, patient is psychiatrically cleared and recommended for discharge. TTS provided patient and mom with list of substance abuse programs to include detox and outpatient treatment.        Chief Complaint:  Chief Complaint  Patient presents with  . Addiction Problem   Visit Diagnosis: Methamphetamine abuse   CCA Screening, Triage and Referral (STR)  Patient Reported Information How did you hear about [place]? Family/Friend  Referral name: mental Health Search (Phreesia 12/05/2020)  Referral phone number: No data recorded  Whom do you see for routine medical problems? Hospital ER (Phreesia 12/05/2020)  Practice/Facility Name: [facility] (Phreesia 12/05/2020)  Practice/Facility Phone Number: No data recorded Name of Contact: No data recorded Contact Number: No data recorded Contact Fax Number: No data recorded Prescriber Name: No Name (Phreesia 12/05/2020)  Prescriber Address (if known): No data recorded  What Is the Reason for Your Visit/Call Today? Daughter Is Having Halleucinations (Phreesia 12/05/2020)  How Long Has This Been Causing You Problems? 1 wk - 1 month  What Do You Feel Would Help You the Most Today? Other (Comment) (Detox)   Have You Recently Been in Any Inpatient Treatment (Hospital/Detox/Crisis Center/28-Day Program)? No  Name/Location of Program/Hospital:No data recorded How Long Were You There? No data recorded When Were You Discharged? No data recorded  Have You Ever Received Services From [facility] Before? Yes  Who Do You See at [facility]? ER (Phreesia 12/05/2020)   Have You Recently Had Any Thoughts About Hurting Yourself? No  Are  You Planning to Commit Suicide/Harm Yourself At This  time? No   Have you Recently Had Thoughts About Hurting Someone Karolee Ohslse? No  Explanation: No data recorded  Have You Used Any Alcohol or Drugs in the Past 24 Hours? Yes  How Long Ago Did You Use Drugs or Alcohol? No data recorded What Did You Use and How Much? Meth ANd Zanex (Phreesia 12/05/2020)   Do You Currently Have a Therapist/Psychiatrist? No  Name of Therapist/Psychiatrist: No data recorded  Have You Been Recently Discharged From Any Office Practice or Programs? No  Explanation of Discharge From Practice/Program: No data recorded    CCA Screening Triage Referral Assessment Type of Contact: Face-to-Face  Is this Initial or Reassessment? No data recorded Date Telepsych consult ordered in [facility]:  No data recorded Time Telepsych consult ordered in [facility]:  No data recorded  Patient Reported Information Reviewed? Yes  Patient Left Without Being Seen? No data recorded Reason for Not Completing Assessment: No data recorded  Collateral Involvement: Theresa-mom   Does Patient Have a Court Appointed Legal Guardian? No data recorded Name and Contact of Legal Guardian: No data recorded If Minor and Not Living with Parent(s), Who has Custody? No data recorded Is CPS involved or ever been involved? In the Past  Is APS involved or ever been involved? Never   Patient Determined To Be At Risk for Harm To Self or Others Based on Review of Patient Reported Information or Presenting Complaint? No  Method: No data recorded Availability of Means: No data recorded Intent: No data recorded Notification Required: No data recorded Additional Information for Danger to Others Potential: No data recorded Additional Comments for Danger to Others Potential: No data recorded Are There Guns or Other Weapons in Your Home? No data recorded Types of Guns/Weapons: No data recorded Are These Weapons Safely Secured?                            No data recorded Who Could Verify You Are Able To Have  These Secured: No data recorded Do You Have any Outstanding Charges, Pending Court Dates, Parole/Probation? No data recorded Contacted To Inform of Risk of Harm To Self or Others: No data recorded  [facility]   Does Patient Present under Involuntary Commitment? No  IVC Papers Initial File Date: No data recorded  [place]   Patient Currently Receiving the Following Services: No data recorded  Determination of Need: Urgent (48 hours)   Options For Referral: Chemical Dependency Intensive Outpatient Therapy (CDIOP); Medication Management; Outpatient Therapy     CCA Biopsychosocial Intake/Chief Complaint:  auditory hallucinations, paranoia  Current Symptoms/Problems: Patient reports feeling depressed and acknowledges symptoms of crying, irritable and irregular sleep patterns. Patient reports having withdrawal symptoms but cannot articulate the symptoms.   Patient Reported Schizophrenia/Schizoaffective Diagnosis in Past: No   Strengths: UTA  Preferences: UTA  Abilities: UTA   Type of Services Patient Feels are Needed: Detox   Initial Clinical Notes/Concerns: No data recorded  Mental Health Symptoms Depression:  Increase/decrease in appetite; Irritability; Sleep (too much or little); Tearfulness   Duration of Depressive symptoms: Greater than two weeks   Mania:  N/A   Anxiety:   Worrying; Tension   Psychosis:  Hallucinations   Duration of Psychotic symptoms: Less than six months   Trauma:  N/A   Obsessions:  N/A   Compulsions:  N/A   Inattention:  N/A  Hyperactivity/Impulsivity:  N/A   Oppositional/Defiant Behaviors:  Temper   Emotional Irregularity:  N/A   Other Mood/Personality Symptoms:  No data recorded   Mental Status Exam Appearance and self-care  Stature:  Average   Weight:  Thin   Clothing:  Disheveled   Grooming:  Neglected   Cosmetic use:  None   Posture/gait:   Bizarre   Motor activity:  Agitated   Sensorium  Attention:  Distractible; Confused   Concentration:  Scattered   Orientation:  Place; Person   Recall/memory:  Defective in Immediate; Defective in Short-term   Affect and Mood  Affect:  Anxious; Tearful   Mood:  Depressed   Relating  Eye contact:  Fleeting   Facial expression:  Anxious; Sad   Attitude toward examiner:  Cooperative   Thought and Language  Speech flow: Slurred; Articulation error   Thought content:  Appropriate to Mood and Circumstances   Preoccupation:  None   Hallucinations:  Auditory   Organization:  No data recorded  [employer] of Knowledge:  Impoverished by (Comment)   Intelligence:  Average   Abstraction:  No data recorded  Judgement:  Impaired   Reality Testing:  Distorted   Insight:  No data recorded  Decision Making:  Confused   Social Functioning  Social Maturity:  Irresponsible   Social Judgement:  No data recorded  Stress  Stressors:  Housing; Armed forces operational officer; Work; Relationship   Coping Ability:  Exhausted; Overwhelmed   Skill Deficits:  No data recorded  Supports:  Family     Religion:    Leisure/Recreation:    Exercise/Diet: Exercise/Diet Do You Have Any Trouble Sleeping?: Yes   CCA Employment/Education Employment/Work Situation: Employment / Work Situation Employment situation: Unemployed What is the longest time patient has a held a job?: UTA Where was the patient employed at that time?: UTA  Education: Education Is Patient Currently Attending School?: No Did Garment/textile technologist From [employer]?: Yes Did Theme park manager?: No Did Designer, television/film set?: No   CCA Family/Childhood History Family and Relationship History: Family history What is your sexual orientation?: UTA Has your sexual activity been affected by drugs, alcohol, medication, or emotional stress?: UTA Does patient have children?: Yes How many children?: 2 How is patient's  relationship with their children?: CPS involved and pt lost custody  Childhood History:  Childhood History Additional childhood history information: UTA Description of patient's relationship with caregiver when they were a child: UTA Patient's description of current relationship with people who raised him/her: UTA How were you disciplined when you got in trouble as a child/adolescent?: UTA  Child/Adolescent Assessment:     CCA Substance Use Alcohol/Drug Use: Alcohol / Drug Use Pain Medications: See MAR Prescriptions: See MAR Over the Counter: See MAR History of alcohol / drug use?: Yes Longest period of sobriety (when/how long): UTA Negative Consequences of Use: Legal,Personal relationships,Work / School,Financial Withdrawal Symptoms: Agitation,Irritability,Tremors Substance #1 Name of Substance 1: Opioids 1 - Duration: ongoing 1 - Last Use / Amount: two days ago Substance #2 Name of Substance 2: methamphetamine 2 - Duration: ongoing 2 - Last Use / Amount: today Substance #3 Name of Substance 3: Cocaine                   ASAM's:  Six Dimensions of Multidimensional Assessment  Dimension 1:  Acute Intoxication and/or Withdrawal Potential:      Dimension 2:  Biomedical Conditions and Complications:      Dimension 3:  Emotional, Behavioral,  or Cognitive Conditions and Complications:     Dimension 4:  Readiness to Change:     Dimension 5:  Relapse, Continued use, or Continued Problem Potential:     Dimension 6:  Recovery/Living Environment:     ASAM Severity Score:    ASAM Recommended Level of Treatment: ASAM Recommended Level of Treatment: Level III Residential Treatment   Substance use Disorder (SUD) Substance Use Disorder (SUD)  Checklist Symptoms of Substance Use: Continued use despite having a persistent/recurrent physical/psychological problem caused/exacerbated by use,Continued use despite persistent or recurrent social, interpersonal problems, caused or  exacerbated by use,Persistent desire or unsuccessful efforts to cut down or control use,Recurrent use that results in a failure to fulfill major role obligations (work, school, home),Repeated use in physically hazardous situations,Social, occupational, recreational activities given up or reduced due to use  Recommendations for Services/Supports/Treatments: Recommendations for Services/Supports/Treatments Recommendations For Services/Supports/Treatments: Detox  DSM5 Diagnoses: Patient Active Problem List   Diagnosis Date Noted  . Cellulitis of left elbow 11/19/2018  . Anxiety 11/19/2018  . Tachycardia 11/19/2018  . Arthritis 01/26/2018  . Polysubstance abuse (HCC) 01/26/2018  . Active labor 08/05/2015  . Diffuse abdominal pain 05/02/2015  . Abdominal pain during pregnancy, antepartum   . [redacted] weeks gestation of pregnancy   . [redacted] weeks gestation of pregnancy   . Encounter for fetal anatomic survey   . Hereditary disease in family possibly affecting fetus, affecting management of mother, antepartum condition or complication   . Previous child with anomaly, antepartum    Disposition: Per Hillery Jacks, NP, patient is psychiatrically cleared and recommended for discharge. TTS provided patient and mom with list of substance abuse programs to include detox and outpatient treatment.  Bedford Winsor Shirlee More, [facility]

## 2021-08-14 ENCOUNTER — Emergency Department
Admission: EM | Admit: 2021-08-14 | Discharge: 2021-08-14 | Disposition: A | Discharge status: Home / Self Care | Payer: Self-pay | Attending: Emergency Medicine | Admitting: Emergency Medicine

## 2021-08-14 ENCOUNTER — Emergency Department: Payer: Self-pay

## 2021-08-14 ENCOUNTER — Other Ambulatory Visit: Payer: Self-pay

## 2021-08-14 ENCOUNTER — Encounter: Payer: Self-pay | Admitting: Emergency Medicine

## 2021-08-14 DIAGNOSIS — K5900 Constipation, unspecified: Secondary | ICD-10-CM

## 2021-08-14 DIAGNOSIS — K30 Functional dyspepsia: Secondary | ICD-10-CM

## 2021-08-14 DIAGNOSIS — F17210 Nicotine dependence, cigarettes, uncomplicated: Secondary | ICD-10-CM | POA: Insufficient documentation

## 2021-08-14 DIAGNOSIS — R1012 Left upper quadrant pain: Secondary | ICD-10-CM | POA: Insufficient documentation

## 2021-08-14 LAB — COMPREHENSIVE METABOLIC PANEL
ALT: 23 U/L (ref 0–44)
AST: 22 U/L (ref 15–41)
Albumin: 4.2 g/dL (ref 3.5–5.0)
Alkaline Phosphatase: 63 U/L (ref 38–126)
Anion gap: 7 (ref 5–15)
BUN: 26 mg/dL — ABNORMAL HIGH (ref 6–20)
CO2: 26 mmol/L (ref 22–32)
Calcium: 9.3 mg/dL (ref 8.9–10.3)
Chloride: 107 mmol/L (ref 98–111)
Creatinine, Ser: 0.79 mg/dL (ref 0.44–1.00)
GFR, Estimated: >60 mL/min (ref >60)
Glucose, Bld: 101 mg/dL — ABNORMAL HIGH (ref 70–99)
Potassium: 3.7 mmol/L (ref 3.5–5.1)
Sodium: 140 mmol/L (ref 135–145)
Total Bilirubin: 0.4 mg/dL (ref 0.3–1.2)
Total Protein: 7.2 g/dL (ref 6.5–8.1)

## 2021-08-14 LAB — CBC WITH DIFFERENTIAL/PLATELET
Abs Immature Granulocytes: 0.03 10*3/uL (ref 0.00–0.07)
Basophils Absolute: 0.1 10*3/uL (ref 0.0–0.1)
Basophils Relative: 1 %
Eosinophils Absolute: 0.2 10*3/uL (ref 0.0–0.5)
Eosinophils Relative: 2 %
HCT: 27.8 % — ABNORMAL LOW (ref 36.0–46.0)
Hemoglobin: 9.1 g/dL — ABNORMAL LOW (ref 12.0–15.0)
Immature Granulocytes: 0 %
Lymphocytes Relative: 42 %
Lymphs Abs: 3 10*3/uL (ref 0.7–4.0)
MCH: 28.3 pg (ref 26.0–34.0)
MCHC: 32.7 g/dL (ref 30.0–36.0)
MCV: 86.6 fL (ref 80.0–100.0)
Monocytes Absolute: 0.4 10*3/uL (ref 0.1–1.0)
Monocytes Relative: 6 %
Neutro Abs: 3.3 10*3/uL (ref 1.7–7.7)
Neutrophils Relative %: 49 %
Platelets: 312 10*3/uL (ref 150–400)
RBC: 3.21 MIL/uL — ABNORMAL LOW (ref 3.87–5.11)
RDW: 15.9 % — ABNORMAL HIGH (ref 11.5–15.5)
WBC: 7 10*3/uL (ref 4.0–10.5)
nRBC: 0 % (ref 0.0–0.2)

## 2021-08-14 LAB — LIPASE, BLOOD: Lipase: 45 U/L (ref 11–51)

## 2021-08-14 LAB — LACTIC ACID, PLASMA: Lactic Acid, Venous: 1.3 mmol/L (ref 0.5–1.9)

## 2021-08-14 LAB — HCG, QUANTITATIVE, PREGNANCY: hCG, Beta Chain, Quant, S: 1 m[IU]/mL (ref <5)

## 2021-08-14 MED ORDER — MORPHINE SULFATE (PF) 4 MG/ML IV SOLN
4.0000 mg | Freq: Once | INTRAVENOUS | Status: AC
  Administered 2021-08-14: 4 mg via INTRAVENOUS
  Filled 2021-08-14: qty 1

## 2021-08-14 MED ORDER — IOHEXOL 350 MG/ML SOLN
75.0000 mL | Freq: Once | INTRAVENOUS | Status: AC | PRN
  Administered 2021-08-14: 75 mL via INTRAVENOUS

## 2021-08-14 MED ORDER — ONDANSETRON HCL 4 MG/2ML IJ SOLN
4.0000 mg | Freq: Once | INTRAMUSCULAR | Status: AC
  Administered 2021-08-14: 4 mg via INTRAVENOUS
  Filled 2021-08-14: qty 2

## 2021-08-14 MED ORDER — LACTATED RINGERS IV BOLUS
1000.0000 mL | Freq: Once | INTRAVENOUS | Status: AC
  Administered 2021-08-14: 1000 mL via INTRAVENOUS

## 2021-08-14 MED ORDER — ONDANSETRON 4 MG PO TBDP: 4.0000 mg | ORAL_TABLET | Freq: Three times a day (TID) | ORAL | 0 refills | Status: DC | PRN

## 2021-08-14 NOTE — ED Notes (Signed)
Verbalized understanding discharge instructions, medications, and follow-up. In no acute distress.   

## 2021-08-14 NOTE — ED Provider Notes (Signed)
[facility] Emergency Department Provider Note   ____________________________________________   Event Date/Time   First MD Initiated Contact with Patient 08/14/21 0309     (approximate)  I have reviewed the triage vital signs and the nursing notes.   HISTORY  Chief Complaint Abdominal Pain    HPI Nicole [name] is a 30 y.o. 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  She underwent duodenojejunostomy with gastrostomy placement and improved, was discharged with G-tube in place but this was subsequently removed 4 days ago at her follow-up appointment.  She states she has been dealing with increasing pain in her upper abdomen since then.  She feels like her abdomen is increasingly distended and she has felt nauseous, but she has not vomited.  Pain is primarily located in the area of her gastrostomy tube site.  It is described as constant and sharp, not exacerbated or alleviated by anything.  She has not had any changes in her bowel movements and denies any dysuria or flank pain.        Past Medical History:  Diagnosis Date   Depression    hx age 78, fine now   Dysrhythmia    "top of heart beats 2 times faster than bottom of heart" (11/20/2018)   Exercise-induced asthma    GERD (gastroesophageal reflux disease)    Headache    "at least 1/month" (11/20/2018)   Hypoglycemia    Irregular heart rate    Recurrent UTI (urinary tract infection)    UTI   Scoliosis    Seizure (HCC) 2019 X 1   "never went to hospital; don't know why I had this" (11/20/2018)   Syncope     Patient Active Problem List   Diagnosis Date Noted   Cellulitis of left elbow 11/19/2018   Anxiety 11/19/2018   Tachycardia 11/19/2018    Arthritis 01/26/2018   Polysubstance abuse (HCC) 01/26/2018   Active labor 08/05/2015   Diffuse abdominal pain 05/02/2015   Abdominal pain during pregnancy, antepartum    [redacted] weeks gestation of pregnancy    [redacted] weeks gestation of pregnancy    Encounter for fetal anatomic survey    Hereditary disease in family possibly affecting fetus, affecting management of mother, antepartum condition or complication    Previous child with anomaly, antepartum     Past Surgical History:  Procedure Laterality Date   ABDOMINAL SURGERY     GASTROSTOMY TUBE PLACEMENT     MOUTH SURGERY     "cut skin out of upper lip area"    Prior to Admission medications   Medication Sig Start Date End Date Taking? Authorizing Provider  ondansetron (ZOFRAN ODT) 4 MG disintegrating tablet Take 1 tablet (4 mg total) by mouth every 8 (eight) hours as needed for nausea or vomiting. 08/14/21  Yes Chesley Noon, MD  buprenorphine (SUBUTEX) 2 MG SUBL SL tablet Place under the tongue daily.    [provider]  cephALEXin (KEFLEX) 500 MG capsule Take 1 capsule (500 mg total) by mouth 4 (four) times daily. 04/05/20   Terrilee Files, MD    Allergies Hydrocodone  Family History  Problem Relation Age of Onset   Cancer Mother 39       breast   Cancer Maternal Grandmother  lung   Diabetes Paternal Grandfather    Otilio Jefferson sequence Son        diagnosed prenatally, delivered and followed postnatally with [facility]    Social History Social History   Tobacco Use   Smoking status: Every Day    Packs/day: 0.50    Years: 12.00    Pack years: 6.00    Types: Cigarettes   Smokeless tobacco: Never  Vaping Use   Vaping Use: Some days   Substances: Nicotine  Substance Use Topics   Alcohol use: Not Currently    Comment: 11/20/2018 "drink maybe once/year"   Drug use: Yes    Types: Marijuana, Heroin, Methamphetamines    Comment: 11/20/2018 "couple times/year"    Review of Systems  Constitutional: No  fever/chills Eyes: No visual changes. ENT: No sore throat. Cardiovascular: Denies chest pain. Respiratory: Denies shortness of breath. Gastrointestinal: Positive for abdominal pain, distention, and nausea, no vomiting.  No diarrhea.  No constipation. Genitourinary: Negative for dysuria. Musculoskeletal: Negative for back pain. Skin: Negative for rash. Neurological: Negative for headaches, focal weakness or numbness.  ____________________________________________   PHYSICAL EXAM:  VITAL SIGNS: ED Triage Vitals  Enc Vitals Group     BP 08/14/21 0121 103/74     Pulse Rate 08/14/21 0121 (!) 107     Resp 08/14/21 0121 (!) 21     Temp 08/14/21 0121 98.1 F (36.7 C)     Temp Source 08/14/21 0121 Oral     SpO2 08/14/21 0121 100 %     Weight 08/14/21 0125 120 lb (54.4 kg)     Height 08/14/21 0125 5\' 9"  (1.753 m)     Head Circumference --      Peak Flow --      Pain Score 08/14/21 0125 9     Pain Loc --      Pain Edu? --      Excl. in GC? --     Constitutional: Alert and oriented. Eyes: Conjunctivae are normal. Head: Atraumatic. Nose: No congestion/rhinnorhea. Mouth/Throat: Mucous membranes are moist. Neck: Normal ROM Cardiovascular: Normal rate, regular rhythm. Grossly normal heart sounds.  2+ radial pulses bilaterally. Respiratory: Normal respiratory effort.  No retractions. Lungs CTAB. Gastrointestinal: Soft and diffusely tender to palpation with no rebound or guarding, mild distention noted.  Previous gastrostomy tube site is clean, dry, and intact. Genitourinary: deferred Musculoskeletal: No lower extremity tenderness nor edema. Neurologic:  Normal speech and language. No gross focal neurologic deficits are appreciated. Skin:  Skin is warm, dry and intact. No rash noted.  Track marks noted to extremities. Psychiatric: Mood and affect are normal. Speech and behavior are normal.  ____________________________________________   LABS (all labs ordered are listed, but only  abnormal results are displayed)  Labs Reviewed  CBC WITH DIFFERENTIAL/PLATELET - Abnormal; Notable for the following components:      Result Value   RBC 3.21 (*)    Hemoglobin 9.1 (*)    HCT 27.8 (*)    RDW 15.9 (*)    All other components within normal limits  COMPREHENSIVE METABOLIC PANEL - Abnormal; Notable for the following components:   Glucose, Bld 101 (*)    BUN 26 (*)    All other components within normal limits  LACTIC ACID, PLASMA  LIPASE, BLOOD  HCG, QUANTITATIVE, PREGNANCY  URINALYSIS, COMPLETE (UACMP) WITH MICROSCOPIC  POC URINE PREG, ED   ____________________________________________  EKG  ED ECG REPORT I, 08/16/21, the attending physician, personally viewed and interpreted this ECG.   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  Patient is mildly distended with diffuse tenderness on exam, gastrostomy tube site does appear to be well-healing.  Labs are unremarkable, LFTs and lipase within normal limits.  We will further assess with CT scan, treat symptomatically with IV morphine and Zofran, hydrate with IV fluids.  CT scan is negative for acute process, no evidence of bowel obstruction or infection.  CT images were reviewed with Dr. Aleen Campi of general surgery, who states that anastomosis appears well and agrees there is no free air or obstruction.  He does state there is constipation that could be contributing to patient's pain.  She  additionally was thought to have delayed gastric emptying by her surgical team that could be contributing to current symptoms.  She was advised to eat small meals and start bowel regimen.  She was counseled to follow-up with her surgical team and to return to the ED for new or worsening symptoms.  Patient agrees with plan.      ____________________________________________   FINAL CLINICAL IMPRESSION(S) / ED DIAGNOSES  Final diagnoses:  Left upper quadrant abdominal pain  Delayed gastric emptying     ED Discharge Orders          Ordered    ondansetron (ZOFRAN ODT) 4 MG disintegrating tablet  Every 8 hours PRN        08/14/21 0733             Note:  This document was prepared using Dragon voice recognition software and may include unintentional dictation errors.    Chesley Noon, MD 08/14/21 [phone]

## 2021-08-14 NOTE — Discharge Instructions (Addendum)

## 2021-08-14 NOTE — ED Triage Notes (Signed)
Pt to ED via POV, states recently has surgery for SMAS and Gtube placement. Pt states recently had Gtube removed. Pt with noted abdominal distention at this time. Pt also c/o SOB. Pt states abd pain started at 0900 today and has progressively worsened since then. Pt states 4 days ago at general surgeons office Gtube was removed, since then swelling over site after difficulty pulling Gtube.

## 2021-08-14 NOTE — ED Notes (Signed)
Pt states cannot urinate at this time 

## 2021-09-02 ENCOUNTER — Ambulatory Visit (INDEPENDENT_AMBULATORY_CARE_PROVIDER_SITE_OTHER): Payer: No Payment, Other | Admitting: Clinical

## 2021-09-02 ENCOUNTER — Other Ambulatory Visit: Payer: Self-pay

## 2021-09-02 DIAGNOSIS — F331 Major depressive disorder, recurrent, moderate: Secondary | ICD-10-CM

## 2021-09-02 DIAGNOSIS — F1521 Other stimulant dependence, in remission: Secondary | ICD-10-CM

## 2021-09-02 DIAGNOSIS — F1421 Cocaine dependence, in remission: Secondary | ICD-10-CM

## 2021-09-02 DIAGNOSIS — F1121 Opioid dependence, in remission: Secondary | ICD-10-CM
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
## 2021-09-05 ENCOUNTER — Other Ambulatory Visit: Payer: Self-pay

## 2021-09-05 ENCOUNTER — Encounter (HOSPITAL_COMMUNITY): Payer: Self-pay | Admitting: Psychiatry

## 2021-09-05 ENCOUNTER — Ambulatory Visit (INDEPENDENT_AMBULATORY_CARE_PROVIDER_SITE_OTHER): Payer: No Payment, Other | Admitting: Psychiatry

## 2021-09-05 VITALS — BP 109/78 | HR 98 | Ht 69.0 in | Wt 134.0 lb

## 2021-09-05 DIAGNOSIS — F419 Anxiety disorder, unspecified: Secondary | ICD-10-CM

## 2021-09-05 DIAGNOSIS — F322 Major depressive disorder, single episode, severe without psychotic features: Secondary | ICD-10-CM

## 2021-09-05 MED ORDER — HYDROXYZINE HCL 10 MG PO TABS
10.0000 mg | ORAL_TABLET | Freq: Three times a day (TID) | ORAL | 3 refills | Status: DC | PRN
  Filled 2021-09-05: qty 90, 30d supply, fill #0

## 2021-09-05 MED ORDER — BUSPIRONE HCL 10 MG PO TABS
10.0000 mg | ORAL_TABLET | Freq: Three times a day (TID) | ORAL | 3 refills | Status: DC
  Filled 2021-09-05: qty 90, 30d supply, fill #0

## 2021-09-05 MED ORDER — TRAZODONE HCL 50 MG PO TABS
50.0000 mg | ORAL_TABLET | Freq: Every evening | ORAL | 3 refills | Status: DC | PRN
  Filled 2021-09-05: qty 30, 30d supply, fill #0
  Filled 2021-09-20: qty 30, 30d supply, fill #1
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
## 2021-09-06 ENCOUNTER — Other Ambulatory Visit: Payer: Self-pay

## 2021-09-07 ENCOUNTER — Ambulatory Visit (HOSPITAL_COMMUNITY): Payer: No Payment, Other | Admitting: Clinical

## 2021-09-20 ENCOUNTER — Other Ambulatory Visit: Payer: Self-pay

## 2021-10-28 ENCOUNTER — Ambulatory Visit (INDEPENDENT_AMBULATORY_CARE_PROVIDER_SITE_OTHER): Payer: No Payment, Other | Admitting: Licensed Clinical Social Worker

## 2021-10-28 DIAGNOSIS — F331 Major depressive disorder, recurrent, moderate: Secondary | ICD-10-CM

## 2021-10-28 DIAGNOSIS — F419 Anxiety disorder, unspecified: Secondary | ICD-10-CM

## 2021-10-28 NOTE — Progress Notes (Signed)
   THERAPIST PROGRESS NOTE     Virtual Visit via Video Note  I connected with Nicole [name] on 10/28/21 at 10:00 AM EST by a video enabled telemedicine application and verified that I am speaking with the correct person using two identifiers.  Location: Patient: [facility]  Provider: Provider home office    I discussed the limitations of evaluation and management by telemedicine and the availability of in person appointments. The patient expressed understanding and agreed to proceed.  Pt was alert and oriented x 5. She was dressed casually and engaged well in therapy session. Pt presented with depressed and anxious mood/affect. She was pleasant cooperative and maintained good eye contact.   Pt primary stressor is family conflict. Pt reports that she has poor communication with her mother. 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 She reports that her mother will talk down to her at times. This has caused frustration, fight, and tension in their relationship.   Intervention: LCSW and pt worked on Restaurant manager, fast food in session. Such as utilizing silence, open ended questions, and taking constructive feedback. LCSW used language for empowerment, praise, and encouragement.   Plan: Have a 1 on 1 conversation with her mother before next session. Pt to speak with her NA sponsor about when the conversation to happen so she has support. Pt will then speak about this conversation in next session      I discussed the assessment and treatment plan with the patient. The patient was provided an opportunity to ask questions and all were answered. The patient agreed with the plan and demonstrated an understanding of the instructions.   The patient was advised to call back or seek an in-person evaluation if the symptoms worsen or if the condition fails to improve as anticipated.  I provided 45 minutes of non-face-to-face time during this  encounter.   Weber Cooks, LCSW   Participation Level: Active  Behavioral Response: Casual and Fairly GroomedAlertAnxious and Depressed  Type of Therapy: Individual Therapy  Treatment Goals addressed: Anxiety and Diagnosis: Depression  Interventions: Strength-based, Supportive, and Reframing   Suicidal/Homicidal: NAwithout intent/plan    Plan: Return again in 4 weeks.      Weber Cooks, LCSW 10/28/2021

## 2021-12-07 ENCOUNTER — Encounter (HOSPITAL_COMMUNITY): Payer: No Payment, Other | Admitting: Psychiatry

## 2021-12-21 ENCOUNTER — Ambulatory Visit (INDEPENDENT_AMBULATORY_CARE_PROVIDER_SITE_OTHER): Payer: No Payment, Other | Admitting: Licensed Clinical Social Worker

## 2021-12-21 DIAGNOSIS — F331 Major depressive disorder, recurrent, moderate: Secondary | ICD-10-CM

## 2021-12-21 NOTE — Progress Notes (Signed)
° °  THERAPIST PROGRESS NOTE  Virtual Visit via Video Note  I connected with Nicole [name] on 12/21/21 at  3:00 PM EST by a video enabled telemedicine application and verified that I am speaking with the correct person using two identifiers.  Location: Patient: Nicole [name]  Provider: [facility]    I discussed the limitations of evaluation and management by telemedicine and the availability of in person appointments. The patient expressed understanding and agreed to proceed.      I discussed the assessment and treatment plan with the patient. The patient was provided an opportunity to ask questions and all were answered. The patient agreed with the plan and demonstrated an understanding of the instructions.   The patient was advised to call back or seek an in-person evaluation if the symptoms worsen or if the condition fails to improve as anticipated.  I provided 30 minutes of non-face-to-face time during this encounter.   Dory Horn, LCSW   Participation Level: Active  Behavioral Response: CasualAlertAnxious and Depressed  Type of Therapy: Individual Therapy  Treatment Goals addressed: Coping and Diagnosis: major depression  Interventions: CBT, Motivational Interviewing, and Supportive  Summary: Nicole [name] is a 31 y.o. female who presents with euthymic mood\affect.  Patient was alert and oriented x5.  Nicole [name] was pleasant, cooperative, and maintained good eye contact.  She engaged well in therapy session and was dressed casually.  Primary stressor in today's session was family conflict, work, and housing.  Patient reports that she is currently living with her parents, boyfriend, child, part-time child, and cousin.  All these people live in a 3 bedroom home and patient states that it can become overwhelming at times due to space.  Patient reports that her cousin and boyfriend both are recovering addicts as well and attend frequent Narcotics  Anonymous meetings.   Patient also states that she attempts to attend at least 3 Narcotics Anonymous meetings per week.  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  When he reported in last session that she was having struggles with communication with her mother.  Patient reports that that communication has improved due to open and honest communication with her mother.  Patient reports that she told her mother "derogatory language would not be beneficial towards my recovery".  Suicidal/Homicidal: Nowithout intent/plan  Therapist Response:    Intervention/Plan: LCSW utilized cognitive behavioral therapy, motivational interviewing, supportive therapy and person centered therapy.  LCSW utilized genuineness in today's session along with praise, encouragement, and empowerment.  LCSW educated patient on PHQ-9 and GAD-7 which were both administered in today's session.  Patient increased her PHQ-9 score by 2 points going from a 7 to a 9 and also her GAD-7 score patient decreased that score by 2 points going from a 9 to a 7 in today's session.  Patient endorses poor sleep but also states that she has ran out of her sleeping medications and needs to get it refilled.  Plan for patient is to refill her sleeping medication.  Follow-up with this LCSW in 4 weeks.  And continue to make progress on her PHQ-9 and GAD-7 scores.    Plan: Return again in 4 weeks.     Dory Horn, LCSW 12/21/2021

## 2022-01-04 ENCOUNTER — Telehealth (HOSPITAL_COMMUNITY): Payer: No Payment, Other | Admitting: Psychiatry

## 2022-01-18 ENCOUNTER — Telehealth (HOSPITAL_COMMUNITY): Payer: Self-pay | Admitting: Licensed Clinical Social Worker

## 2022-01-18 ENCOUNTER — Encounter (HOSPITAL_COMMUNITY): Payer: Self-pay

## 2022-01-18 ENCOUNTER — Ambulatory Visit (HOSPITAL_COMMUNITY): Payer: No Payment, Other | Admitting: Licensed Clinical Social Worker

## 2022-01-18 NOTE — Telephone Encounter (Signed)
LCSW sent 3 links to patient's phone 1 at 1102, 1105, and at 1111.  LCSW also followed up with a phone call at 1109 for appointment reminder.  LCSW called the phone number listed as mobile number in epic but patient has a voicemail box that is not set up.  LCSW waited until 1115 before disconnecting

## 2022-02-16 ENCOUNTER — Ambulatory Visit (HOSPITAL_COMMUNITY): Payer: No Payment, Other | Admitting: Licensed Clinical Social Worker

## 2022-02-16 ENCOUNTER — Telehealth (HOSPITAL_COMMUNITY): Payer: Self-pay | Admitting: Licensed Clinical Social Worker

## 2022-02-16 ENCOUNTER — Encounter (HOSPITAL_COMMUNITY): Payer: Self-pay

## 2022-02-16 NOTE — Telephone Encounter (Signed)
LCSW sent two links to pt phone with no response. LCSW f/u with PC mother answer and is listed as emergency contact. Mother reports pt is sick. Pt will be marked as a no show.  ?

## 2022-03-09 ENCOUNTER — Telehealth (INDEPENDENT_AMBULATORY_CARE_PROVIDER_SITE_OTHER): Payer: No Payment, Other | Admitting: Psychiatry

## 2022-03-09 DIAGNOSIS — F322 Major depressive disorder, single episode, severe without psychotic features: Secondary | ICD-10-CM | POA: Diagnosis not present

## 2022-03-09 DIAGNOSIS — F419 Anxiety disorder, unspecified: Secondary | ICD-10-CM | POA: Diagnosis not present

## 2022-03-09 MED ORDER — TRAZODONE HCL 50 MG PO TABS: 50.0000 mg | ORAL_TABLET | Freq: Every evening | ORAL | 3 refills | Status: AC | PRN

## 2022-03-09 MED ORDER — BUSPIRONE HCL 10 MG PO TABS: 10.0000 mg | ORAL_TABLET | Freq: Three times a day (TID) | ORAL | 3 refills | Status: AC

## 2022-03-09 MED ORDER — HYDROXYZINE HCL 10 MG PO TABS: 10.0000 mg | ORAL_TABLET | Freq: Three times a day (TID) | ORAL | 3 refills | Status: AC | PRN

## 2022-03-09 NOTE — Progress Notes (Signed)
BH MD/PA/NP OP Progress Note ? ?03/09/2022 3:41 PM ?Jonell Cluck Piccirilli  ?MRN:  [id number] ? ?Virtual Visit via Telephone Note ? ?I connected with Nicole [name] on 03/09/22 at  3:30 PM EDT by telephone and verified that I am speaking with the correct person using two identifiers. ? ?Location: ?Patient: home   ?Provider: offsite ?  ?I discussed the limitations, risks, security and privacy concerns of performing an evaluation and management service by telephone and the availability of in person appointments. I also discussed with the patient that there may be a patient responsible charge related to this service. The patient expressed understanding and agreed to proceed. ? ?  ?I discussed the assessment and treatment plan with the patient. The patient was provided an opportunity to ask questions and all were answered. The patient agreed with the plan and demonstrated an understanding of the instructions. ?  ?The patient was advised to call back or seek an in-person evaluation if the symptoms worsen or if the condition fails to improve as anticipated. ? ?I provided 10 minutes of non-face-to-face time during this encounter. ? ? ?Mcneil Sober, NP  ? ?Chief Complaint: Medication management ? ?HPI: Nicole [name] is a 31 year old female presenting to [facility] behavioral health outpatient for follow-up psychiatric evaluation.  She has a psychiatric history of major depressive disorder polysubstance use, and anxiety.  Her symptoms are managed with BuSpar 10 mg 3 times daily, hydroxyzine 10 mg 3 times daily as needed for anxiety, and trazodone 50 mg at bedtime as needed for sleep.  Patient reports medication compliance and denies adverse effects.  She reports that her medications are effective for managing her symptoms and denies need for dosage adjustment today. ? ?Patient is alert and oriented x4, calm, pleasant and willing to engage.  She reports good mood appetite and sleep.  Patient denies suicidal or  homicidal ideations, paranoia, delusional thought, auditory or visual hallucinations. ? ?Past Psychiatric History: Major depressive disorder, polysubstance use and anxiety ? ?Past Medical History:  ?Past Medical History:  ?Diagnosis Date  ? Depression   ? hx age 3, fine now  ? Dysrhythmia   ? "top of heart beats 2 times faster than bottom of heart" (11/20/2018)  ? Exercise-induced asthma   ? GERD (gastroesophageal reflux disease)   ? Headache   ? "at least 1/month" (11/20/2018)  ? Hypoglycemia   ? Irregular heart rate   ? Recurrent UTI (urinary tract infection)   ? UTI  ? Scoliosis   ? Seizure (HCC) 2019 X 1  ? "never went to hospital; don't know why I had this" (11/20/2018)  ? Syncope   ?  ?Past Surgical History:  ?Procedure Laterality Date  ? ABDOMINAL SURGERY    ? GASTROSTOMY TUBE PLACEMENT    ? MOUTH SURGERY    ? "cut skin out of upper lip area"  ? ? ?Family Psychiatric History: None known ? ?Family History:  ?Family History  ?Problem Relation Age of Onset  ? Cancer Mother 35  ?     breast  ? Cancer Maternal Grandmother   ?     lung  ? Diabetes Paternal Grandfather   ? Otilio Jefferson sequence Son   ?     diagnosed prenatally, delivered and followed postnatally with [facility]  ? ? ?Social History:  ?Social History  ? ?Socioeconomic History  ? Marital status: Divorced  ?  Spouse name: Not on file  ? Number of children: Not on file  ? Years of education: Not  on file  ? Highest education level: Not on file  ?Occupational History  ? Not on file  ?Tobacco Use  ? Smoking status: Every Day  ?  Packs/day: 0.50  ?  Years: 12.00  ?  Pack years: 6.00  ?  Types: Cigarettes  ? Smokeless tobacco: Never  ?Vaping Use  ? Vaping Use: Some days  ? Substances: Nicotine  ?Substance and Sexual Activity  ? Alcohol use: Not Currently  ?  Comment: 11/20/2018 "drink maybe once/year"  ? Drug use: Yes  ?  Types: Marijuana, Heroin, Methamphetamines  ?  Comment: 11/20/2018 "couple times/year"  ? Sexual activity: Yes  ?  Comment: Last sexual encounter 2  months ago, used condom  ?Other Topics Concern  ? Not on file  ?Social History Narrative  ? Not on file  ? ?Social Determinants of Health  ? ?Financial Resource Strain: Not on file  ?Food Insecurity: Not on file  ?Transportation Needs: Not on file  ?Physical Activity: Not on file  ?Stress: Not on file  ?Social Connections: Not on file  ? ? ?Allergies:  ?Allergies  ?Allergen Reactions  ? Hydrocodone Hives and Itching  ? ? ?Metabolic Disorder Labs: ?No results found for: HGBA1C, MPG ?No results found for: PROLACTIN ?No results found for: CHOL, TRIG, HDL, CHOLHDL, VLDL, LDLCALC ?Lab Results  ?Component Value Date  ? TSH 2.796 01/26/2018  ? ? ?Therapeutic Level Labs: ?No results found for: LITHIUM ?No results found for: VALPROATE ?No components found for:  CBMZ ? ?Current Medications: ?Current Outpatient Medications  ?Medication Sig Dispense Refill  ? buprenorphine (SUBUTEX) 2 MG SUBL SL tablet Place under the tongue daily.    ? busPIRone (BUSPAR) 10 MG tablet Take 1 tablet (10 mg total) by mouth 3 (three) times daily. 90 tablet 3  ? cephALEXin (KEFLEX) 500 MG capsule Take 1 capsule (500 mg total) by mouth 4 (four) times daily. 28 capsule 0  ? hydrOXYzine (ATARAX/VISTARIL) 10 MG tablet Take 1 tablet (10 mg total) by mouth 3 (three) times daily as needed. 90 tablet 3  ? ondansetron (ZOFRAN ODT) 4 MG disintegrating tablet Take 1 tablet (4 mg total) by mouth every 8 (eight) hours as needed for nausea or vomiting. 12 tablet 0  ? traZODone (DESYREL) 50 MG tablet Take 1 tablet (50 mg total) by mouth at bedtime as needed for sleep. 30 tablet 3  ? ?No current facility-administered medications for this visit.  ? ? ? ?Musculoskeletal: ?Strength & Muscle Tone: N/A virtual visit ?Gait & Station: N/A ?Patient leans: N/A ? ?Psychiatric Specialty Exam: ?Review of Systems  ?Psychiatric/Behavioral:  Negative for hallucinations, self-injury and suicidal ideas.   ?All other systems reviewed and are negative.  ?unknown if currently  breastfeeding.There is no height or weight on file to calculate BMI.  ?General Appearance: N/A  ?Eye Contact: N/A  ?Speech: Clear and coherent  ?Volume:  Normal  ?Mood:  Euthymic  ?Affect:  NA  ?Thought Process:  Goal Directed  ?Orientation:  Full (Time, Place, and Person)  ?Thought Content: Logical   ?Suicidal Thoughts:  No  ?Homicidal Thoughts:  No  ?Memory:  Immediate;   Good ?Recent;   Good ?Remote;   Good  ?Judgement:  Good  ?Insight:  Good  ?Psychomotor Activity:  NA  ?Concentration:  Concentration: Good and Attention Span: Good  ?Recall:  Good  ?Fund of Knowledge: Good  ?Language: good  ?Akathisia:  NA  ?Handed:  Left  ?AIMS (if indicated): not done  ?Assets:  Communication Skills ?Desire  for Improvement  ?ADL's:  Intact  ?Cognition: WNL  ?Sleep:  Good  ? ?Screenings: ?GAD-7   ? ?Flowsheet Row [facility]  ?Total GAD-7 Score 7 9 19 19   ? ?  ? ?PHQ2-9   ? ?Flowsheet Row Counsel[facility]20[facility]  ?PHQ-2 Total Score 2 1 5 4 6   ?PHQ-9 Total Score 9 7 20 21 25   ? ?  ? ?Flowsheet Row [facility]20[facility]2021 [facility]  ?C-SSRS RISK CATEGORY No Risk No Risk No Risk  ? ?  ? ? ? ?Assessment and Plan: Nicole BachWhitney [name] is a 31 year [facility] follow-up psychiatric evaluation.  She has a psychiatric history of major depressive disorder polysubstance use, and anxiety.  Her symptoms are managed with BuSpar 10 mg 3 times

## 2022-06-06 ENCOUNTER — Telehealth (HOSPITAL_COMMUNITY): Payer: No Payment, Other | Admitting: Psychiatry

## 2023-02-27 ENCOUNTER — Emergency Department (HOSPITAL_BASED_OUTPATIENT_CLINIC_OR_DEPARTMENT_OTHER): Payer: Self-pay

## 2023-02-27 ENCOUNTER — Other Ambulatory Visit: Payer: Self-pay

## 2023-02-27 ENCOUNTER — Emergency Department (HOSPITAL_BASED_OUTPATIENT_CLINIC_OR_DEPARTMENT_OTHER)
Admission: EM | Admit: 2023-02-27 | Discharge: 2023-02-28 | Disposition: A | Discharge status: Home / Self Care | Payer: Self-pay | Attending: Emergency Medicine | Admitting: Emergency Medicine

## 2023-02-27 DIAGNOSIS — Z20822 Contact with and (suspected) exposure to covid-19: Secondary | ICD-10-CM | POA: Insufficient documentation

## 2023-02-27 DIAGNOSIS — R791 Abnormal coagulation profile: Secondary | ICD-10-CM | POA: Insufficient documentation

## 2023-02-27 DIAGNOSIS — R112 Nausea with vomiting, unspecified: Secondary | ICD-10-CM | POA: Insufficient documentation

## 2023-02-27 DIAGNOSIS — R002 Palpitations: Secondary | ICD-10-CM | POA: Insufficient documentation

## 2023-02-27 DIAGNOSIS — D649 Anemia, unspecified: Secondary | ICD-10-CM | POA: Insufficient documentation

## 2023-02-27 DIAGNOSIS — J439 Emphysema, unspecified: Secondary | ICD-10-CM | POA: Insufficient documentation

## 2023-02-27 DIAGNOSIS — N3001 Acute cystitis with hematuria: Secondary | ICD-10-CM | POA: Insufficient documentation

## 2023-02-27 LAB — RESP PANEL BY RT-PCR (RSV, FLU A&B, COVID)  RVPGX2
Influenza A by PCR: NEGATIVE
Influenza B by PCR: NEGATIVE
Resp Syncytial Virus by PCR: NEGATIVE
SARS Coronavirus 2 by RT PCR: NEGATIVE

## 2023-02-27 LAB — URINALYSIS, ROUTINE W REFLEX MICROSCOPIC
Glucose, UA: NEGATIVE mg/dL
Ketones, ur: NEGATIVE mg/dL
Leukocytes,Ua: NEGATIVE
Nitrite: POSITIVE — AB
Protein, ur: >=300 mg/dL — AB
Specific Gravity, Urine: >=1.03 (ref 1.005–1.030)
pH: 5.5 (ref 5.0–8.0)

## 2023-02-27 LAB — URINALYSIS, MICROSCOPIC (REFLEX): RBC / HPF: >50 RBC/hpf (ref 0–5)

## 2023-02-27 LAB — PREGNANCY, URINE: Preg Test, Ur: NEGATIVE

## 2023-02-27 MED ORDER — SODIUM CHLORIDE 0.9 % IV BOLUS
500.0000 mL | Freq: Once | INTRAVENOUS | Status: AC
  Administered 2023-02-28: 500 mL via INTRAVENOUS

## 2023-02-27 MED ORDER — SODIUM CHLORIDE 0.9 % IV SOLN
1.0000 g | Freq: Once | INTRAVENOUS | Status: AC
  Administered 2023-02-28: 1 g via INTRAVENOUS
  Filled 2023-02-27: qty 10

## 2023-02-27 NOTE — ED Triage Notes (Signed)
Pt reports constantly having palpitations and shortness of breath but reports feeling better when lying down. Pt reports intermittently feeling lightheaded with the palpitations. Pt reports approx 4 episodes of emesis per day over the last 4 days. Pt denies CP.  Pt denies swelling to legs or recent travel.

## 2023-02-28 ENCOUNTER — Emergency Department (HOSPITAL_BASED_OUTPATIENT_CLINIC_OR_DEPARTMENT_OTHER): Payer: Self-pay

## 2023-02-28 LAB — COMPREHENSIVE METABOLIC PANEL
ALT: 22 U/L (ref 0–44)
AST: 27 U/L (ref 15–41)
Albumin: 3.8 g/dL (ref 3.5–5.0)
Alkaline Phosphatase: 107 U/L (ref 38–126)
Anion gap: 6 (ref 5–15)
BUN: 21 mg/dL — ABNORMAL HIGH (ref 6–20)
CO2: 28 mmol/L (ref 22–32)
Calcium: 8.8 mg/dL — ABNORMAL LOW (ref 8.9–10.3)
Chloride: 101 mmol/L (ref 98–111)
Creatinine, Ser: 1.16 mg/dL — ABNORMAL HIGH (ref 0.44–1.00)
GFR, Estimated: >60 mL/min (ref >60)
Glucose, Bld: 95 mg/dL (ref 70–99)
Potassium: 3.8 mmol/L (ref 3.5–5.1)
Sodium: 135 mmol/L (ref 135–145)
Total Bilirubin: 0.5 mg/dL (ref 0.3–1.2)
Total Protein: 7.3 g/dL (ref 6.5–8.1)

## 2023-02-28 LAB — CBC
HCT: 33.4 % — ABNORMAL LOW (ref 36.0–46.0)
Hemoglobin: 11.2 g/dL — ABNORMAL LOW (ref 12.0–15.0)
MCH: 28.9 pg (ref 26.0–34.0)
MCHC: 33.5 g/dL (ref 30.0–36.0)
MCV: 86.3 fL (ref 80.0–100.0)
Platelets: 311 10*3/uL (ref 150–400)
RBC: 3.87 MIL/uL (ref 3.87–5.11)
RDW: 13.3 % (ref 11.5–15.5)
WBC: 6.1 10*3/uL (ref 4.0–10.5)
nRBC: 0 % (ref 0.0–0.2)

## 2023-02-28 LAB — D-DIMER, QUANTITATIVE: D-Dimer, Quant: 0.6 ug/mL-FEU — ABNORMAL HIGH (ref 0.00–0.50)

## 2023-02-28 MED ORDER — IOHEXOL 350 MG/ML SOLN
75.0000 mL | Freq: Once | INTRAVENOUS | Status: AC | PRN
  Administered 2023-02-28: 75 mL via INTRAVENOUS

## 2023-02-28 MED ORDER — CEPHALEXIN 500 MG PO CAPS: 500.0000 mg | ORAL_CAPSULE | Freq: Four times a day (QID) | ORAL | 0 refills | Status: DC

## 2023-02-28 NOTE — ED Provider Notes (Signed)
[facility] Provider Note   CSN: [id number] Arrival date & time: 02/27/23  1919     History  Chief Complaint  Patient presents with   Palpitations    Nicole [name] is a 32 y.o. female.  The history is provided by the patient.  Palpitations Palpitations quality:  Fast Timing:  Constant Progression:  Unchanged Chronicity:  New Relieved by:  Nothing Worsened by:  Nothing Ineffective treatments:  None tried Associated symptoms: nausea and vomiting   Associated symptoms: no chest pain and no chest pressure   Risk factors: no hx of PE   Has also had 4 episodes of emesis.  Sent in by her methadone clinic for work up.       Home Medications Prior to Admission medications   Medication Sig Start Date End Date Taking? Authorizing Provider  buprenorphine (SUBUTEX) 2 MG SUBL SL tablet Place under the tongue daily.    [provider]  busPIRone (BUSPAR) 10 MG tablet Take 1 tablet (10 mg total) by mouth 3 (three) times daily. 03/09/22   Penn, Lunette Stands, NP  hydrOXYzine (ATARAX) 10 MG tablet Take 1 tablet (10 mg total) by mouth 3 (three) times daily as needed. 03/09/22   Penn, Lunette Stands, NP  traZODone (DESYREL) 50 MG tablet Take 1 tablet (50 mg total) by mouth at bedtime as needed for sleep. 03/09/22   Franne Grip, NP      Allergies    Hydrocodone    Review of Systems   Review of Systems  Constitutional:  Negative for fever.  HENT:  Negative for facial swelling.   Cardiovascular:  Positive for palpitations. Negative for chest pain and leg swelling.  Gastrointestinal:  Positive for nausea and vomiting.  All other systems reviewed and are negative.   Physical Exam Updated Vital Signs BP 102/78   Pulse 79   Temp 98 F (36.7 C) (Oral)   Resp 12   Ht '5\' 9"'$  (1.753 m)   Wt 63.5 kg   LMP 02/23/2023 (Approximate)   SpO2 100%   BMI 20.67 kg/m  Physical Exam Vitals and nursing note reviewed. Exam conducted with a  chaperone present.  Constitutional:      General: She is not in acute distress.    Appearance: Normal appearance. She is well-developed.  HENT:     Head: Normocephalic and atraumatic.     Nose: Nose normal.  Eyes:     Pupils: Pupils are equal, round, and reactive to light.  Cardiovascular:     Rate and Rhythm: Normal rate and regular rhythm.     Pulses: Normal pulses.     Heart sounds: Normal heart sounds.  Pulmonary:     Effort: Pulmonary effort is normal. No respiratory distress.     Breath sounds: Normal breath sounds.  Abdominal:     General: Bowel sounds are normal. There is no distension.     Palpations: Abdomen is soft.     Tenderness: There is no abdominal tenderness. There is no guarding or rebound.  Genitourinary:    Vagina: No vaginal discharge.  Musculoskeletal:        General: Normal range of motion.     Cervical back: Neck supple.  Skin:    General: Skin is warm and dry.     Capillary Refill: Capillary refill takes less than 2 seconds.     Findings: No erythema or rash.  Neurological:     General: No focal deficit present.  Mental Status: She is alert and oriented to person, place, and time.     Deep Tendon Reflexes: Reflexes normal.  Psychiatric:        Mood and Affect: Mood normal.     ED Results / Procedures / Treatments   Labs (all labs ordered are listed, but only abnormal results are displayed) Results for orders placed or performed during the hospital encounter of 02/27/23  Resp panel by RT-PCR (RSV, Flu A&B, Covid) Anterior Nasal Swab   Specimen: Anterior Nasal Swab  Result Value Ref Range   SARS Coronavirus 2 by RT PCR NEGATIVE NEGATIVE   Influenza A by PCR NEGATIVE NEGATIVE   Influenza B by PCR NEGATIVE NEGATIVE   Resp Syncytial Virus by PCR NEGATIVE NEGATIVE  CBC  Result Value Ref Range   WBC 6.1 4.0 - 10.5 K/uL   RBC 3.87 3.87 - 5.11 MIL/uL   Hemoglobin 11.2 (L) 12.0 - 15.0 g/dL   HCT 33.4 (L) 36.0 - 46.0 %   MCV 86.3 80.0 - 100.0 fL    MCH 28.9 26.0 - 34.0 pg   MCHC 33.5 30.0 - 36.0 g/dL   RDW 13.3 11.5 - 15.5 %   Platelets 311 150 - 400 K/uL   nRBC 0.0 0.0 - 0.2 %  Comprehensive metabolic panel  Result Value Ref Range   Sodium 135 135 - 145 mmol/L   Potassium 3.8 3.5 - 5.1 mmol/L   Chloride 101 98 - 111 mmol/L   CO2 28 22 - 32 mmol/L   Glucose, Bld 95 70 - 99 mg/dL   BUN 21 (H) 6 - 20 mg/dL   Creatinine, Ser 1.16 (H) 0.44 - 1.00 mg/dL   Calcium 8.8 (L) 8.9 - 10.3 mg/dL   Total Protein 7.3 6.5 - 8.1 g/dL   Albumin 3.8 3.5 - 5.0 g/dL   AST 27 15 - 41 U/L   ALT 22 0 - 44 U/L   Alkaline Phosphatase 107 38 - 126 U/L   Total Bilirubin 0.5 0.3 - 1.2 mg/dL   GFR, Estimated >60 >60 mL/min   Anion gap 6 5 - 15  Urinalysis, Routine w reflex microscopic -Urine, Clean Catch  Result Value Ref Range   Color, Urine BROWN (A) YELLOW   APPearance CLOUDY (A) CLEAR   Specific Gravity, Urine >=1.030 1.005 - 1.030   pH 5.5 5.0 - 8.0   Glucose, UA NEGATIVE NEGATIVE mg/dL   Hgb urine dipstick LARGE (A) NEGATIVE   Bilirubin Urine MODERATE (A) NEGATIVE   Ketones, ur NEGATIVE NEGATIVE mg/dL   Protein, ur >=300 (A) NEGATIVE mg/dL   Nitrite POSITIVE (A) NEGATIVE   Leukocytes,Ua NEGATIVE NEGATIVE  Pregnancy, urine  Result Value Ref Range   Preg Test, Ur NEGATIVE NEGATIVE  Urinalysis, Microscopic (reflex)  Result Value Ref Range   RBC / HPF >50 0 - 5 RBC/hpf   WBC, UA 6-10 0 - 5 WBC/hpf   Bacteria, UA MANY (A) NONE SEEN   Squamous Epithelial / HPF 0-5 0 - 5 /HPF  D-dimer, quantitative  Result Value Ref Range   D-Dimer, Quant 0.60 (H) 0.00 - 0.50 ug/mL-FEU   CT Angio Chest PE W and/or Wo Contrast  Result Date: 02/28/2023 CLINICAL DATA:  Syncope EXAM: CT ANGIOGRAPHY CHEST WITH CONTRAST TECHNIQUE: Multidetector CT imaging of the chest was performed using the standard protocol during bolus administration of intravenous contrast. Multiplanar CT image reconstructions and MIPs were obtained to evaluate the vascular anatomy.  RADIATION DOSE REDUCTION: This exam was performed according to  the departmental dose-optimization program which includes automated exposure control, adjustment of the mA and/or kV according to patient size and/or use of iterative reconstruction technique. CONTRAST:  70m OMNIPAQUE IOHEXOL 350 MG/ML SOLN COMPARISON:  Chest x-ray 02/27/2023 FINDINGS: Cardiovascular: Satisfactory opacification of the pulmonary arteries to the segmental level. No evidence of pulmonary embolism. Normal heart size. No pericardial effusion. Nonaneurysmal aorta. No dissection is seen. Mediastinum/Nodes: No enlarged mediastinal, hilar, or axillary lymph nodes. Thyroid gland, trachea, and esophagus demonstrate no significant findings. Lungs/Pleura: No acute airspace disease or pleural effusion. Minimal bronchiectasis and scarring in the right apical lung. Emphysema in the upper lobes. Focal scarring in the right lower lobe. 4 mm nodule in the right upper lobe, series 7, image 43, likely post inflammatory or infectious, no specific imaging follow-up is recommended. Upper Abdomen: No acute abnormality. Musculoskeletal: No chest wall abnormality. No acute or significant osseous findings. Review of the MIP images confirms the above findings. IMPRESSION: 1. Negative for acute pulmonary embolus or aortic dissection. 2. Age advanced emphysema in upper lobes. Minimal bronchiectasis and scarring in the right apical lung. Emphysema (ICD10-J43.9). Electronically Signed   By: KDonavan FoilM.D.   On: 02/28/2023 01:05   DG Chest 2 View  Result Date: 02/27/2023 CLINICAL DATA:  Shortness of breath EXAM: CHEST - 2 VIEW COMPARISON:  Chest x-ray 11/11/2017 FINDINGS: The heart size and mediastinal contours are within normal limits. Both lungs are clear. The visualized skeletal structures are unremarkable. IMPRESSION: No active cardiopulmonary disease. Electronically Signed   By: ARonney AstersM.D.   On: 02/27/2023 19:56     EKG EKG  Interpretation  Date/Time:  Tuesday February 27 2023 19:29:36 EDT Ventricular Rate:  104 PR Interval:  127 QRS Duration: 83 QT Interval:  404 QTC Calculation: 532 R Axis:   87 Text Interpretation: Sinus tachycardia Borderline T abnormalities, diffuse leads Prolonged QT interval No significant change since last tracing Confirmed by BGeorgina [phone] on 02/27/2023 7:37:24 PM  Radiology CT Angio Chest PE W and/or Wo Contrast  Result Date: 02/28/2023 CLINICAL DATA:  Syncope EXAM: CT ANGIOGRAPHY CHEST WITH CONTRAST TECHNIQUE: Multidetector CT imaging of the chest was performed using the standard protocol during bolus administration of intravenous contrast. Multiplanar CT image reconstructions and MIPs were obtained to evaluate the vascular anatomy. RADIATION DOSE REDUCTION: This exam was performed according to the departmental dose-optimization program which includes automated exposure control, adjustment of the mA and/or kV according to patient size and/or use of iterative reconstruction technique. CONTRAST:  742mOMNIPAQUE IOHEXOL 350 MG/ML SOLN COMPARISON:  Chest x-ray 02/27/2023 FINDINGS: Cardiovascular: Satisfactory opacification of the pulmonary arteries to the segmental level. No evidence of pulmonary embolism. Normal heart size. No pericardial effusion. Nonaneurysmal aorta. No dissection is seen. Mediastinum/Nodes: No enlarged mediastinal, hilar, or axillary lymph nodes. Thyroid gland, trachea, and esophagus demonstrate no significant findings. Lungs/Pleura: No acute airspace disease or pleural effusion. Minimal bronchiectasis and scarring in the right apical lung. Emphysema in the upper lobes. Focal scarring in the right lower lobe. 4 mm nodule in the right upper lobe, series 7, image 43, likely post inflammatory or infectious, no specific imaging follow-up is recommended. Upper Abdomen: No acute abnormality. Musculoskeletal: No chest wall abnormality. No acute or significant osseous findings.  Review of the MIP images confirms the above findings. IMPRESSION: 1. Negative for acute pulmonary embolus or aortic dissection. 2. Age advanced emphysema in upper lobes. Minimal bronchiectasis and scarring in the right apical lung. Emphysema (ICD10-J43.9). Electronically Signed   By: KiMaudie Mercury  Francoise Ceo M.D.   On: 02/28/2023 01:05   DG Chest 2 View  Result Date: 02/27/2023 CLINICAL DATA:  Shortness of breath EXAM: CHEST - 2 VIEW COMPARISON:  Chest x-ray 11/11/2017 FINDINGS: The heart size and mediastinal contours are within normal limits. Both lungs are clear. The visualized skeletal structures are unremarkable. IMPRESSION: No active cardiopulmonary disease. Electronically Signed   By: Ronney Asters M.D.   On: 02/27/2023 19:56    Procedures Procedures    Medications Ordered in ED Medications  sodium chloride 0.9 % bolus 500 mL (500 mLs Intravenous New Bag/Given 02/28/23 0000)  cefTRIAXone (ROCEPHIN) 1 g in sodium chloride 0.9 % 100 mL IVPB (1 g Intravenous New Bag/Given 02/28/23 0000)  iohexol (OMNIPAQUE) 350 MG/ML injection 75 mL (75 mLs Intravenous Contrast Given 02/28/23 0046)    ED Course/ Medical Decision Making/ A&P                             Medical Decision Making Patient with palpitations and 4 episodes of emesis    Amount and/or Complexity of Data Reviewed External Data Reviewed: notes.    Details: Previous notes reviewed  Labs: ordered.    Details: All labs reviewed:  urine is positive for UTI.  Negative covid and flu.  Normal white count 6.1, low hemoglobin 11.2 normal platelet count.  Ddimer 0.6 elevated (proceed to CTA) normal sodium  135, normal potassium 3.8, normal creatinine.  Normal LFTS Radiology: ordered and independent interpretation performed.    Details: Negative CTA for PE by me  ECG/medicine tests: ordered and independent interpretation performed. Decision-making details documented in ED Course.  Risk Prescription drug management. Risk Details: Nausea and emesis  likely secondary to UTI which we are treating. No PE as a source of palpitations.  HR is normal in the ED.  Patient was informed of emphysema seen on CTA and need to not smoke or vbape.  I have referred patient to pulmonology for ongoing follow up care.  RX for keflex sent to pharmacy.  FOllow up with your PMD.  Stable for discharge.      Final Clinical Impression(s) / ED Diagnoses Final diagnoses:  Acute cystitis with hematuria  Pulmonary emphysema, unspecified emphysema type ([place])  Palpitations   Return for intractable cough, coughing up blood, fevers > 100.4 unrelieved by medication, shortness of breath, intractable vomiting, chest pain, shortness of breath, weakness, numbness, changes in speech, facial asymmetry, abdominal pain, passing out, Inability to tolerate liquids or food, cough, altered mental status or any concerns. No signs of systemic illness or infection. The patient is nontoxic-appearing on exam and vital signs are within normal limits.  I have reviewed the triage vital signs and the nursing notes. Pertinent labs & imaging results that were available during my care of the patient were reviewed by me and considered in my medical decision making (see chart for details). After history, exam, and medical workup I feel the patient has been appropriately medically screened and is safe for discharge home. Pertinent diagnoses were discussed with the patient. Patient was given return precautions. Rx / DC Orders ED Discharge Orders     None         Nolawi Kanady, MD 02/28/23 [id number]

## 2023-08-16 ENCOUNTER — Ambulatory Visit (INDEPENDENT_AMBULATORY_CARE_PROVIDER_SITE_OTHER): Payer: 59 | Admitting: Obstetrics & Gynecology

## 2023-08-16 ENCOUNTER — Encounter: Payer: Self-pay | Admitting: Obstetrics & Gynecology

## 2023-08-16 ENCOUNTER — Other Ambulatory Visit (HOSPITAL_COMMUNITY)
Admission: RE | Admit: 2023-08-16 | Discharge: 2023-08-16 | Disposition: A | Discharge status: Home / Self Care | Payer: 59 | Source: Ambulatory Visit | Attending: Obstetrics & Gynecology | Admitting: Obstetrics & Gynecology

## 2023-08-16 VITALS — BP 101/67 | HR 75 | Ht 69.0 in | Wt 161.5 lb

## 2023-08-16 DIAGNOSIS — N390 Urinary tract infection, site not specified: Secondary | ICD-10-CM | POA: Diagnosis not present

## 2023-08-16 DIAGNOSIS — R350 Frequency of micturition: Secondary | ICD-10-CM | POA: Diagnosis not present

## 2023-08-16 DIAGNOSIS — Z01419 Encounter for gynecological examination (general) (routine) without abnormal findings: Secondary | ICD-10-CM | POA: Diagnosis not present

## 2023-08-16 DIAGNOSIS — F1190 Opioid use, unspecified, uncomplicated: Secondary | ICD-10-CM

## 2023-08-16 LAB — POCT URINALYSIS DIPSTICK OB
Glucose, UA: NEGATIVE
Ketones, UA: NEGATIVE
Leukocytes, UA: NEGATIVE
Nitrite, UA: POSITIVE
POC,PROTEIN,UA: NEGATIVE

## 2023-08-16 NOTE — Addendum Note (Signed)
Addended by: Caralyn Guile on: 08/16/2023 12:25 PM   Modules accepted: Orders

## 2023-08-16 NOTE — Progress Notes (Signed)
Subjective:     Lowella Karlissa Helmkamp is a 32 y.o. female here for a routine exam.  Patient's last menstrual period was 07/31/2023 (exact date). [id number] Birth Control Method:  none Menstrual Calendar(currently): regular  Current complaints: none.   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 Last Pap: years ago,   Last mammogram: na,    Past Medical History:  Diagnosis Date   Depression    hx age 49, fine now   Dysrhythmia    "top of heart beats 2 times faster than bottom of heart" (11/20/2018)   Exercise-induced asthma    GERD (gastroesophageal reflux disease)    Headache    "at least 1/month" (11/20/2018)   Hypoglycemia    Irregular heart rate    Recurrent UTI (urinary tract infection)    UTI   Scoliosis    Seizure (HCC) 2019 X 1   "never went to hospital; don't know why I had this" (11/20/2018)   Syncope     Past Surgical History:  Procedure Laterality Date   ABDOMINAL SURGERY     GASTROSTOMY TUBE PLACEMENT     MOUTH SURGERY     "cut skin out of upper lip area"    OB History     Gravida  2   Para  2   Term  2   Preterm      AB      Living  2      SAB      IAB      Ectopic      Multiple  0   Live Births  2           Social History   Socioeconomic History   Marital status: Significant Other    Spouse name: Not on file   Number of children: 2   Years of education: Not on file   Highest education level: Not on file  Occupational History   Not on file  Tobacco Use   Smoking status: Former    Current packs/day: 0.50    Average packs/day: 0.5 packs/day for 12.0 years (6.0 ttl pk-yrs)    Types: Cigarettes   Smokeless tobacco: Never  Vaping Use   Vaping status: Every Day   Substances: Nicotine  Substance and Sexual Activity   Alcohol use: Not Currently    Comment: 11/20/2018 "drink maybe once/year"   Drug use: Not Currently     Types: Marijuana, Heroin, Methamphetamines    Comment: 11/20/2018 "couple times/year"   Sexual activity: Yes    Birth control/protection: None    Comment: Last sexual encounter 2 months ago, used condom  Other Topics Concern   Not on file  Social History Narrative   Not on file   Social Determinants of Health   Financial Resource Strain: Medium Risk (08/16/2023)   Overall Financial Resource Strain (CARDIA)    Difficulty of Paying Living Expenses: Somewhat hard  Food Insecurity: No Food Insecurity (08/16/2023)   Hunger Vital Sign    Worried About Running Out of Food in the Last Year: Never true    Ran Out of Food in the Last Year: Never true  Transportation Needs: No Transportation Needs (08/16/2023)   PRAPARE - Administrator, Civil Service (Medical): No    Lack of Transportation (Non-Medical): No  Physical Activity: Sufficiently Active (08/16/2023)   Exercise Vital Sign    Days of Exercise per  Week: 5 days    Minutes of Exercise per Session: 100 min  Stress: Stress Concern Present (08/16/2023)   [employer] of Occupational Health - Occupational Stress Questionnaire    Feeling of Stress : To some extent  Social Connections: Socially Integrated (08/16/2023)   Social Connection and Isolation Panel [NHANES]    Frequency of Communication with Friends and Family: More than three times a week    Frequency of Social Gatherings with Friends and Family: More than three times a week    Attends Religious Services: More than 4 times per year    Active Member of [employer] or Organizations: Yes    Attends Engineer, structural: More than 4 times per year    Marital Status: Living with partner    Family History  Problem Relation Age of Onset   Cancer Mother 12       breast   Cancer Maternal Grandmother        lung   Diabetes Paternal Grandfather    Otilio Jefferson sequence Son        diagnosed prenatally, delivered and followed postnatally with [facility]     Current  Outpatient Medications:    busPIRone (BUSPAR) 10 MG tablet, Take 1 tablet (10 mg total) by mouth 3 (three) times daily., Disp: 90 tablet, Rfl: 3   hydrOXYzine (ATARAX) 10 MG tablet, Take 1 tablet (10 mg total) by mouth 3 (three) times daily as needed., Disp: 90 tablet, Rfl: 3   methadone (DOLOPHINE) 10 MG/ML solution, Take 120 mg by mouth every 8 (eight) hours., Disp: , Rfl:    traZODone (DESYREL) 50 MG tablet, Take 1 tablet (50 mg total) by mouth at bedtime as needed for sleep., Disp: 30 tablet, Rfl: 3  Review of Systems  Review of Systems  Constitutional: Negative for fever, chills, weight loss, malaise/fatigue and diaphoresis.  HENT: Negative for hearing loss, ear pain, nosebleeds, congestion, sore throat, neck pain, tinnitus and ear discharge.   Eyes: Negative for blurred vision, double vision, photophobia, pain, discharge and redness.  Respiratory: Negative for cough, hemoptysis, sputum production, shortness of breath, wheezing and stridor.   Cardiovascular: Negative for chest pain, palpitations, orthopnea, claudication, leg swelling and PND.  Gastrointestinal: negative for abdominal pain. Negative for heartburn, nausea, vomiting, diarrhea, constipation, blood in stool and melena.  Genitourinary: Negative for dysuria, urgency, frequency, hematuria and flank pain.  Musculoskeletal: Negative for myalgias, back pain, joint pain and falls.  Skin: Negative for itching and rash.  Neurological: Negative for dizziness, tingling, tremors, sensory change, speech change, focal weakness, seizures, loss of consciousness, weakness and headaches.  Endo/Heme/Allergies: Negative for environmental allergies and polydipsia. Does not bruise/bleed easily.  Psychiatric/Behavioral: Negative for depression, suicidal ideas, hallucinations, memory loss and substance abuse. The patient is not nervous/anxious and does not have insomnia.        Objective:  Blood pressure 101/67, pulse 75, height 5\' 9"  (1.753 m),  weight 161 lb 8 oz (73.3 kg), last menstrual period 07/31/2023, not currently breastfeeding.   Physical Exam  Vitals reviewed. Constitutional: She is oriented to person, place, and time. She appears well-developed and well-nourished.  HENT:  Head: Normocephalic and atraumatic.        Right Ear: External ear normal.  Left Ear: External ear normal.  Nose: Nose normal.  Mouth/Throat: Oropharynx is clear and moist.  Eyes: Conjunctivae and EOM are normal. Pupils are equal, round, and reactive to light. Right eye exhibits no discharge. Left eye exhibits no discharge. No scleral icterus.  Neck: Normal range of motion. Neck supple. No tracheal deviation present. No thyromegaly present.  Cardiovascular: Normal rate, regular rhythm, normal heart sounds and intact distal pulses.  Exam reveals no gallop and no friction rub.   No murmur heard. Respiratory: Effort normal and breath sounds normal. No respiratory distress. She has no wheezes. She has no rales. She exhibits no tenderness.  GI: Soft. Bowel sounds are normal. She exhibits no distension and no mass. There is no tenderness. There is no rebound and no guarding.  Genitourinary:  Breasts no masses skin changes or nipple changes bilaterally      Vulva is normal without lesions Vagina is pink moist without discharge Cervix normal in appearance and pap is done Uterus is normal size shape and contour Adnexa is negative with normal sized ovaries   Musculoskeletal: Normal range of motion. She exhibits no edema and no tenderness.  Neurological: She is alert and oriented to person, place, and time. She has normal reflexes. She displays normal reflexes. No cranial nerve deficit. She exhibits normal muscle tone. Coordination normal.  Skin: Skin is warm and dry. No rash noted. No erythema. No pallor.  Psychiatric: She has a normal mood and affect. Her behavior is normal. Judgment and thought content normal.       Medications Ordered at today's  visit: No orders of the defined types were placed in this encounter.   Other orders placed at today's visit: No orders of the defined types were placed in this encounter.     Assessment:    Normal Gyn exam.      ICD-10-CM   1. Well woman exam with routine gynecological exam  Z01.419     2. Recurrent UTI(by history, I don't have any cultures)  N39.0     3. Opioid use disorder, on methadone 120 mg daily  F11.90       Plan:    3 years for yealy or prn  Does not want birth control  Would like 1 more child   Return if symptoms worsen or fail to improve.

## 2023-08-20 ENCOUNTER — Other Ambulatory Visit: Payer: Self-pay | Admitting: Obstetrics & Gynecology

## 2023-08-20 MED ORDER — FOSFOMYCIN TROMETHAMINE 3 G PO PACK: 3.0000 g | PACK | Freq: Once | ORAL | 0 refills | Status: AC

## 2023-08-22 LAB — URINE CULTURE

## 2023-08-23 LAB — CYTOLOGY - PAP
Comment: NEGATIVE
Diagnosis: NEGATIVE
High risk HPV: NEGATIVE

## 2023-08-26 ENCOUNTER — Encounter: Payer: Self-pay | Admitting: Obstetrics & Gynecology

## 2023-08-27 ENCOUNTER — Encounter: Payer: Self-pay | Admitting: *Deleted

## 2023-09-05 ENCOUNTER — Other Ambulatory Visit: Payer: 59

## 2023-09-05 DIAGNOSIS — Z87440 Personal history of urinary (tract) infections: Secondary | ICD-10-CM

## 2023-09-11 ENCOUNTER — Encounter: Payer: Self-pay | Admitting: Obstetrics & Gynecology

## 2023-09-11 ENCOUNTER — Other Ambulatory Visit: Payer: Self-pay | Admitting: Obstetrics & Gynecology

## 2023-09-11 LAB — URINE CULTURE

## 2023-09-11 MED ORDER — AMOXICILLIN-POT CLAVULANATE 875-125 MG PO TABS: 1.0000 | ORAL_TABLET | Freq: Two times a day (BID) | ORAL | 0 refills | Status: DC

## 2024-01-09 NOTE — Telephone Encounter (Signed)
 Please inform pt that per urine culture report the abx is resistant to the Keflex  she has been prescribed. Rx Nitrofurantoin (Macrobid) sent to pharmacy on file. Pt should pick this up as soon as possible and take as prescribed. Discontinue the keflex . Follow up as previously indicated.

## 2024-01-10 NOTE — Telephone Encounter (Signed)
 Results and recommendations reviewed, verbalized full understanding.

## 2024-12-22 ENCOUNTER — Other Ambulatory Visit: Payer: Self-pay

## 2024-12-22 ENCOUNTER — Emergency Department (HOSPITAL_BASED_OUTPATIENT_CLINIC_OR_DEPARTMENT_OTHER): Payer: MEDICAID

## 2024-12-22 DIAGNOSIS — N3000 Acute cystitis without hematuria: Secondary | ICD-10-CM | POA: Insufficient documentation

## 2024-12-22 DIAGNOSIS — R1011 Right upper quadrant pain: Secondary | ICD-10-CM | POA: Diagnosis present

## 2024-12-22 LAB — CBC
HCT: 33.8 % — ABNORMAL LOW (ref 36.0–46.0)
Hemoglobin: 11.5 g/dL — ABNORMAL LOW (ref 12.0–15.0)
MCH: 28.7 pg (ref 26.0–34.0)
MCHC: 34 g/dL (ref 30.0–36.0)
MCV: 84.3 fL (ref 80.0–100.0)
Platelets: 280 K/uL (ref 150–400)
RBC: 4.01 MIL/uL (ref 3.87–5.11)
RDW: 12.7 % (ref 11.5–15.5)
WBC: 5.8 K/uL (ref 4.0–10.5)
nRBC: 0 % (ref 0.0–0.2)

## 2024-12-22 LAB — COMPREHENSIVE METABOLIC PANEL WITH GFR
ALT: 10 U/L (ref 0–44)
AST: 19 U/L (ref 15–41)
Albumin: 5 g/dL (ref 3.5–5.0)
Alkaline Phosphatase: 61 U/L (ref 38–126)
Anion gap: 12 (ref 5–15)
BUN: 14 mg/dL (ref 6–20)
CO2: 25 mmol/L (ref 22–32)
Calcium: 9.9 mg/dL (ref 8.9–10.3)
Chloride: 103 mmol/L (ref 98–111)
Creatinine, Ser: 0.94 mg/dL (ref 0.44–1.00)
GFR, Estimated: >60 mL/min
Glucose, Bld: 74 mg/dL (ref 70–99)
Potassium: 3.8 mmol/L (ref 3.5–5.1)
Sodium: 140 mmol/L (ref 135–145)
Total Bilirubin: 0.2 mg/dL (ref 0.0–1.2)
Total Protein: 7.8 g/dL (ref 6.5–8.1)

## 2024-12-22 LAB — URINALYSIS, MICROSCOPIC (REFLEX)

## 2024-12-22 LAB — URINALYSIS, ROUTINE W REFLEX MICROSCOPIC
Bilirubin Urine: NEGATIVE
Glucose, UA: NEGATIVE mg/dL
Ketones, ur: NEGATIVE mg/dL
Leukocytes,Ua: NEGATIVE
Nitrite: POSITIVE — AB
Protein, ur: NEGATIVE mg/dL
Specific Gravity, Urine: 1.015 (ref 1.005–1.030)
pH: 5.5 (ref 5.0–8.0)

## 2024-12-22 LAB — PREGNANCY, URINE: Preg Test, Ur: NEGATIVE

## 2024-12-22 LAB — LIPASE, BLOOD: Lipase: 40 U/L (ref 11–51)

## 2024-12-22 MED ORDER — NAPROXEN 375 MG PO TABS: 375.0000 mg | ORAL_TABLET | Freq: Two times a day (BID) | ORAL | 0 refills | Status: AC

## 2024-12-22 MED ORDER — MORPHINE SULFATE (PF) 4 MG/ML IV SOLN
4.0000 mg | Freq: Once | INTRAVENOUS | Status: AC
  Administered 2024-12-22: 4 mg via INTRAVENOUS
  Filled 2024-12-22: qty 1

## 2024-12-22 MED ORDER — CEPHALEXIN 500 MG PO CAPS: 500.0000 mg | ORAL_CAPSULE | Freq: Four times a day (QID) | ORAL | 0 refills | Status: AC

## 2024-12-22 MED ORDER — LACTATED RINGERS IV BOLUS
1000.0000 mL | Freq: Once | INTRAVENOUS | Status: AC
  Administered 2024-12-22: 1000 mL via INTRAVENOUS

## 2024-12-22 MED ORDER — IOHEXOL 300 MG/ML  SOLN
100.0000 mL | Freq: Once | INTRAMUSCULAR | Status: AC | PRN
  Administered 2024-12-22: 100 mL via INTRAVENOUS

## 2024-12-22 MED ORDER — SODIUM CHLORIDE 0.9 % IV SOLN
1.0000 g | Freq: Once | INTRAVENOUS | Status: AC
  Administered 2024-12-22: 1 g via INTRAVENOUS
  Filled 2024-12-22: qty 10

## 2024-12-22 NOTE — ED Provider Notes (Signed)
 " [place] [facility] Provider Note   CSN: [id number] Arrival date & time: 12/22/24  1643     Patient presents with: Abdominal Pain   Nicole [name] is a 34 y.o. female.    Abdominal Pain    Patient presents ED with complaints of abdominal pain.  Patient states she has been having episodes off-and-on for several months.  Patient states that she will often feel bloated and full after she eats something.  She saw her primary doctor previously and was told she was going to get referred to a GI doctor.  Patient states she went to see her doctor today because she started having the symptoms again.  She continues to have pain in her right upper abdomen.  Her doctor noted she was tender in her upper abdomen so she was sent to the ED for further evaluation  Prior to Admission medications  Medication Sig Start Date End Date Taking? Authorizing Provider  cephALEXin  (KEFLEX ) 500 MG capsule Take 1 capsule (500 mg total) by mouth 4 (four) times daily. 12/22/24  Yes Randol Simmonds, MD  naproxen  (NAPROSYN ) 375 MG tablet Take 1 tablet (375 mg total) by mouth 2 (two) times daily. 12/22/24  Yes Randol Simmonds, MD  busPIRone  (BUSPAR ) 10 MG tablet Take 1 tablet (10 mg total) by mouth 3 (three) times daily. 03/09/22   Penn, Reymundo, NP  hydrOXYzine  (ATARAX ) 10 MG tablet Take 1 tablet (10 mg total) by mouth 3 (three) times daily as needed. 03/09/22   Penn, Reymundo, NP  methadone (DOLOPHINE) 10 MG/ML solution Take 120 mg by mouth every 8 (eight) hours.    [provider]  traZODone  (DESYREL ) 50 MG tablet Take 1 tablet (50 mg total) by mouth at bedtime as needed for sleep. 03/09/22   Sammy Reymundo, NP    Allergies: Hydrocodone     Review of Systems  Gastrointestinal:  Positive for abdominal pain.    Updated Vital Signs BP 114/79   Pulse 81   Temp 98 F (36.7 C) (Oral)   Resp 19   Ht 1.753 m (5' 9)   Wt 79.4 kg   LMP 11/26/2024 (Exact Date)   SpO2 100%   BMI  25.84 kg/m   Physical Exam Vitals and nursing note reviewed.  Constitutional:      General: She is not in acute distress.    Appearance: She is well-developed.  HENT:     Head: Normocephalic and atraumatic.     Right Ear: External ear normal.     Left Ear: External ear normal.  Eyes:     General: No scleral icterus.       Right eye: No discharge.        Left eye: No discharge.     Conjunctiva/sclera: Conjunctivae normal.  Neck:     Trachea: No tracheal deviation.  Cardiovascular:     Rate and Rhythm: Normal rate and regular rhythm.  Pulmonary:     Effort: Pulmonary effort is normal. No respiratory distress.     Breath sounds: Normal breath sounds. No stridor. No wheezing or rales.  Abdominal:     General: Bowel sounds are normal. There is no distension.     Palpations: Abdomen is soft.     Tenderness: There is abdominal tenderness in the right upper quadrant. There is no guarding or rebound.  Musculoskeletal:        General: No tenderness or deformity.     Cervical back: Neck supple.  Skin:  General: Skin is warm and dry.     Findings: No rash.  Neurological:     General: No focal deficit present.     Mental Status: She is alert.     Cranial Nerves: No cranial nerve deficit, dysarthria or facial asymmetry.     Sensory: No sensory deficit.     Motor: No abnormal muscle tone or seizure activity.     Coordination: Coordination normal.  Psychiatric:        Mood and Affect: Mood normal.     (all labs ordered are listed, but only abnormal results are displayed) Labs Reviewed  CBC - Abnormal; Notable for the following components:      Result Value   Hemoglobin 11.5 (*)    HCT 33.8 (*)    All other components within normal limits  URINALYSIS, ROUTINE W REFLEX MICROSCOPIC - Abnormal; Notable for the following components:   APPearance HAZY (*)    Hgb urine dipstick TRACE (*)    Nitrite POSITIVE (*)    All other components within normal limits  URINALYSIS, MICROSCOPIC  (REFLEX) - Abnormal; Notable for the following components:   Bacteria, UA MANY (*)    All other components within normal limits  LIPASE, BLOOD  COMPREHENSIVE METABOLIC PANEL WITH GFR  PREGNANCY, URINE    EKG: None  Radiology: CT ABDOMEN PELVIS W CONTRAST Result Date: 12/22/2024 EXAM: CT ABDOMEN AND PELVIS WITH CONTRAST 12/22/2024 09:16:22 PM TECHNIQUE: CT of the abdomen and pelvis was performed with the administration of 100 mL of iohexol  (OMNIPAQUE ) 300 MG/ML solution. Multiplanar reformatted images are provided for review. Automated exposure control, iterative reconstruction, and/or weight-based adjustment of the mA/kV was utilized to reduce the radiation dose to as low as reasonably achievable. COMPARISON: CT abdomen and pelvis 08/14/2021. CLINICAL HISTORY: Abdominal pain, acute, nonlocalized; RUQ pain. FINDINGS: LOWER CHEST: No acute abnormality. LIVER: The liver is unremarkable. GALLBLADDER AND BILE DUCTS: Gallbladder is unremarkable. No biliary ductal dilatation. SPLEEN: No acute abnormality. PANCREAS: No acute abnormality. ADRENAL GLANDS: No acute abnormality. KIDNEYS, URETERS AND BLADDER: No stones in the kidneys or ureters. No hydronephrosis. No perinephric or periureteral stranding. Urinary bladder is unremarkable. GI AND BOWEL: Stomach demonstrates no acute abnormality. There is no bowel obstruction. The appendix appearance is within normal limits. There is a large amount of stool throughout the colon. Small bowel anastomoses are present. PERITONEUM AND RETROPERITONEUM: There is trace fluid in the pelvis. No free air. VASCULATURE: Aorta is normal in caliber. LYMPH NODES: No lymphadenopathy. REPRODUCTIVE ORGANS: No acute abnormality. BONES AND SOFT TISSUES: Likely degenerative sclerosis seen along the inferior endplate of L4. No acute osseous abnormality. No focal soft tissue abnormality. IMPRESSION: 1. No acute findings in the abdomen or pelvis. 2. Large colonic stool burden. 3. Trace pelvic  free fluid, likely physiologic. 4. Postsurgical small bowel anastomoses. Electronically signed by: Greig Pique MD 12/22/2024 09:27 PM EST RP Workstation: [id number]     Procedures   Medications Ordered in the ED  lactated ringers  bolus 1,000 mL (0 mLs Intravenous Stopped 12/22/24 2055)  iohexol  (OMNIPAQUE ) 300 MG/ML solution 100 mL (100 mLs Intravenous Contrast Given 12/22/24 2057)  morphine  (PF) 4 MG/ML injection 4 mg (4 mg Intravenous Given 12/22/24 2127)  cefTRIAXone  (ROCEPHIN ) 1 g in sodium chloride  0.9 % 100 mL IVPB (1 g Intravenous New Bag/Given 12/22/24 2131)    Clinical Course as of 12/22/24 2201  Mon Dec 22, 2024  1859 Urinalysis, Routine w reflex microscopic -Urine, Clean Catch(!) Urinalysis does show positive nitrate and many  bacteria 6-10 white blood cells [JK]  2133 CT scan without acute finding.  Patient has postsurgical small bowel anastomosis, large colonic stool burden [JK]    Clinical Course User Index [JK] Randol Simmonds, MD                                 Medical Decision Making Differential diagnosis includes but not limited to pancreatitis, hepatitis, peptic ulcer disease, cholecystitis  Problems Addressed: Acute cystitis without hematuria: acute illness or injury that poses a threat to life or bodily functions  Amount and/or Complexity of Data Reviewed Labs: ordered. Decision-making details documented in ED Course. Radiology: ordered and independent interpretation performed.    Details: Ultrasound is not available at this facility at this time.  Will proceed with CT scan imaging  Risk Prescription drug management.   Patient presented to the ED for evaluation of upper abdominal pain.  ED workup reassuring.  Patient does not have any leukocytosis.  No signs of acute kidney injury.  No signs of hepatitis or pancreatitis.  CT scan was performed and it does not show any signs of obstruction cholecystitis or other acute abnormality.  Findings of constipation noted but I  do not feel this is related to the patient's pain.  Patient's urinalysis does suggest infection.  Patient has been having dysuria.  It is possible her upper abdominal pain is related to early pyelonephritis.  Will discharge home on a course of antibiotics.     Final diagnoses:  Acute cystitis without hematuria    ED Discharge Orders          Ordered    cephALEXin  (KEFLEX ) 500 MG capsule  4 times daily        12/22/24 2159    naproxen  (NAPROSYN ) 375 MG tablet  2 times daily        12/22/24 2159               Randol Simmonds, MD 12/22/24 2201  "

## 2024-12-22 NOTE — ED Notes (Signed)
 PIV start attempted, unsuccessful.  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 US  IV placement requested.

## 2024-12-22 NOTE — ED Triage Notes (Signed)
 Pt states that her PCP sent her to be evaluated for her RUQ pain. States that she has been vomiting multiple times and has had a decrease in her intake. PCP told her that she had a positive Murphy sign.

## 2024-12-22 NOTE — ED Notes (Signed)
 D/c paperwork reviewed with pt, including prescriptions and follow up care.  All questions and/or concerns addressed at time of d/c.  No further needs expressed. . Pt verbalized understanding, Ambulatory with significant other to ED exit, NAD.

## 2024-12-22 NOTE — Discharge Instructions (Signed)
 Take the antibiotics as prescribed.  Follow-up with your doctor next week to be rechecked.  Return to the ER for fevers vomiting or other concerning symptoms
